# Patient Record
Sex: Female | Born: 1985 | Race: Black or African American | Hispanic: No | Marital: Single | Smoking: Never smoker
Health system: Southern US, Community
[De-identification: ages and names within clinical notes are randomized; demographics above are authoritative.]

## PROBLEM LIST (undated history)

## (undated) DIAGNOSIS — O139 Gestational [pregnancy-induced] hypertension without significant proteinuria, unspecified trimester: Secondary | ICD-10-CM

## (undated) DIAGNOSIS — D563 Thalassemia minor: Secondary | ICD-10-CM

## (undated) DIAGNOSIS — Z973 Presence of spectacles and contact lenses: Secondary | ICD-10-CM

## (undated) DIAGNOSIS — Z789 Other specified health status: Secondary | ICD-10-CM

## (undated) HISTORY — DX: Other specified health status: Z78.9

## (undated) HISTORY — DX: Gestational (pregnancy-induced) hypertension without significant proteinuria, unspecified trimester: O13.9

---
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
## 2009-11-23 ENCOUNTER — Emergency Department (HOSPITAL_COMMUNITY): Admission: EM | Admit: 2009-11-23 | Discharge: 2009-11-23 | Payer: Self-pay | Admitting: Emergency Medicine

## 2010-11-03 ENCOUNTER — Inpatient Hospital Stay (HOSPITAL_COMMUNITY)
Admission: AD | Admit: 2010-11-03 | Discharge: 2010-11-03 | Payer: Self-pay | Source: Home / Self Care | Admitting: Obstetrics & Gynecology

## 2010-11-06 LAB — URINALYSIS, ROUTINE W REFLEX MICROSCOPIC
Hgb urine dipstick: NEGATIVE
Specific Gravity, Urine: 1.025 (ref 1.005–1.030)
Urine Glucose, Fasting: NEGATIVE mg/dL

## 2010-11-06 LAB — WET PREP, GENITAL
Trich, Wet Prep: NONE SEEN
Yeast Wet Prep HPF POC: NONE SEEN

## 2010-11-06 LAB — URINE MICROSCOPIC-ADD ON

## 2010-12-08 LAB — RPR: RPR: NONREACTIVE

## 2010-12-08 LAB — RUBELLA ANTIBODY, IGM: Rubella: IMMUNE

## 2010-12-08 LAB — ABO/RH

## 2010-12-08 LAB — HEPATITIS B SURFACE ANTIGEN: Hepatitis B Surface Ag: NEGATIVE

## 2011-01-04 LAB — URINALYSIS, ROUTINE W REFLEX MICROSCOPIC
Bilirubin Urine: NEGATIVE
Hgb urine dipstick: NEGATIVE
Ketones, ur: NEGATIVE mg/dL
Nitrite: POSITIVE — AB
Protein, ur: 30 mg/dL — AB
Urobilinogen, UA: 1 mg/dL (ref 0.0–1.0)

## 2011-01-04 LAB — URINE MICROSCOPIC-ADD ON

## 2011-04-16 LAB — STREP B DNA PROBE: GBS: NEGATIVE

## 2011-04-30 ENCOUNTER — Encounter (HOSPITAL_COMMUNITY): Payer: Self-pay | Admitting: *Deleted

## 2011-04-30 ENCOUNTER — Inpatient Hospital Stay (HOSPITAL_COMMUNITY)
Admission: AD | Admit: 2011-04-30 | Discharge: 2011-05-02 | DRG: 775 | Disposition: A | Discharge status: Home / Self Care | Payer: Medicaid Other | Source: Ambulatory Visit | Attending: Obstetrics and Gynecology | Admitting: Obstetrics and Gynecology

## 2011-04-30 ENCOUNTER — Encounter (HOSPITAL_COMMUNITY): Payer: Self-pay | Admitting: Anesthesiology

## 2011-04-30 ENCOUNTER — Inpatient Hospital Stay (HOSPITAL_COMMUNITY): Payer: Medicaid Other | Admitting: Anesthesiology

## 2011-04-30 DIAGNOSIS — IMO0002 Reserved for concepts with insufficient information to code with codable children: Secondary | ICD-10-CM | POA: Diagnosis present

## 2011-04-30 DIAGNOSIS — O4290 Premature rupture of membranes, unspecified as to length of time between rupture and onset of labor, unspecified weeks of gestation: Principal | ICD-10-CM | POA: Diagnosis present

## 2011-04-30 DIAGNOSIS — Z331 Pregnant state, incidental: Secondary | ICD-10-CM

## 2011-04-30 DIAGNOSIS — E669 Obesity, unspecified: Secondary | ICD-10-CM

## 2011-04-30 LAB — CBC
MCH: 24 pg — ABNORMAL LOW (ref 26.0–34.0)
Platelets: 260 10*3/uL (ref 150–400)
RBC: 4.62 MIL/uL (ref 3.87–5.11)
WBC: 7.7 10*3/uL (ref 4.0–10.5)

## 2011-04-30 MED ORDER — TERBUTALINE SULFATE 1 MG/ML IJ SOLN: 0.2500 mg | Freq: Once | INTRAMUSCULAR | Status: AC | PRN

## 2011-04-30 MED ORDER — EPHEDRINE 5 MG/ML INJ
10.0000 mg | INTRAVENOUS | Status: DC | PRN
  Filled 2011-04-30 (×2): qty 4

## 2011-04-30 MED ORDER — LACTATED RINGERS IV SOLN
500.0000 mL | Freq: Once | INTRAVENOUS | Status: AC
  Administered 2011-04-30: 500 mL via INTRAVENOUS

## 2011-04-30 MED ORDER — CITRIC ACID-SODIUM CITRATE 334-500 MG/5ML PO SOLN: 30.0000 mL | ORAL | Status: DC | PRN

## 2011-04-30 MED ORDER — ONDANSETRON HCL 4 MG/2ML IJ SOLN: 4.0000 mg | Freq: Four times a day (QID) | INTRAMUSCULAR | Status: DC | PRN

## 2011-04-30 MED ORDER — IBUPROFEN 600 MG PO TABS
600.0000 mg | ORAL_TABLET | Freq: Four times a day (QID) | ORAL | Status: DC | PRN
  Administered 2011-04-30: 600 mg via ORAL
  Filled 2011-04-30: qty 1

## 2011-04-30 MED ORDER — PHENYLEPHRINE 40 MCG/ML (10ML) SYRINGE FOR IV PUSH (FOR BLOOD PRESSURE SUPPORT)
80.0000 ug | PREFILLED_SYRINGE | INTRAVENOUS | Status: DC | PRN
  Filled 2011-04-30: qty 5

## 2011-04-30 MED ORDER — PHENYLEPHRINE 40 MCG/ML (10ML) SYRINGE FOR IV PUSH (FOR BLOOD PRESSURE SUPPORT)
80.0000 ug | PREFILLED_SYRINGE | INTRAVENOUS | Status: DC | PRN
  Filled 2011-04-30 (×2): qty 5

## 2011-04-30 MED ORDER — LACTATED RINGERS IV SOLN
INTRAVENOUS | Status: DC
  Administered 2011-04-30: 125 mL/h via INTRAVENOUS

## 2011-04-30 MED ORDER — NALBUPHINE SYRINGE 5 MG/0.5 ML
5.0000 mg | INJECTION | INTRAMUSCULAR | Status: DC | PRN
  Filled 2011-04-30 (×2): qty 0.5

## 2011-04-30 MED ORDER — NALBUPHINE HCL 10 MG/ML IJ SOLN
10.0000 mg | Freq: Four times a day (QID) | INTRAMUSCULAR | Status: DC | PRN
  Filled 2011-04-30: qty 1

## 2011-04-30 MED ORDER — LIDOCAINE HCL (PF) 2 % IJ SOLN
INTRAMUSCULAR | Status: DC | PRN
  Administered 2011-04-30 (×3): 40 mg

## 2011-04-30 MED ORDER — DIPHENHYDRAMINE HCL 50 MG/ML IJ SOLN: 12.5000 mg | INTRAMUSCULAR | Status: DC | PRN

## 2011-04-30 MED ORDER — ACETAMINOPHEN 325 MG PO TABS: 650.0000 mg | ORAL_TABLET | ORAL | Status: DC | PRN

## 2011-04-30 MED ORDER — FENTANYL 2.5 MCG/ML BUPIVACAINE 1/10 % EPIDURAL INFUSION (WH - ANES)
14.0000 mL/h | INTRAMUSCULAR | Status: DC
  Administered 2011-04-30: 12 mL/h via EPIDURAL
  Administered 2011-04-30: 14 mL/h via EPIDURAL
  Filled 2011-04-30 (×2): qty 60

## 2011-04-30 MED ORDER — OXYTOCIN 20 UNITS IN LACTATED RINGERS INFUSION - SIMPLE
1.0000 m[IU]/min | INTRAVENOUS | Status: DC
  Administered 2011-04-30: 1 m[IU]/min via INTRAVENOUS
  Filled 2011-04-30: qty 1000

## 2011-04-30 MED ORDER — EPHEDRINE 5 MG/ML INJ
10.0000 mg | INTRAVENOUS | Status: DC | PRN
  Filled 2011-04-30: qty 4

## 2011-04-30 MED ORDER — OXYTOCIN 20 UNITS IN LACTATED RINGERS INFUSION - SIMPLE: 125.0000 mL/h | Freq: Once | INTRAVENOUS | Status: DC

## 2011-04-30 MED ORDER — OXYCODONE-ACETAMINOPHEN 5-325 MG PO TABS: 2.0000 | ORAL_TABLET | ORAL | Status: DC | PRN

## 2011-04-30 MED ORDER — LACTATED RINGERS IV SOLN: 500.0000 mL | INTRAVENOUS | Status: DC | PRN

## 2011-04-30 MED ORDER — LIDOCAINE HCL (PF) 1 % IJ SOLN
30.0000 mL | INTRAMUSCULAR | Status: DC | PRN
  Filled 2011-04-30 (×2): qty 30

## 2011-04-30 MED ORDER — FLEET ENEMA 7-19 GM/118ML RE ENEM: 1.0000 | ENEMA | RECTAL | Status: DC | PRN

## 2011-04-30 NOTE — Anesthesia Procedure Notes (Addendum)
Epidural Patient location during procedure: OB Start time: 04/30/2011 4:30 PM  Staffing Anesthesiologist: Jiles Garter  Preanesthetic Checklist Completed: patient identified, site marked, surgical consent, pre-op evaluation, timeout performed, IV checked, risks and benefits discussed and monitors and equipment checked  Epidural Patient position: sitting Prep: DuraPrep Patient monitoring: continuous pulse ox and blood pressure Approach: midline Injection technique: LOR air  Needle Needle type: Tuohy  Needle gauge: 17 G Needle length: 9 cm Catheter type: closed end flexible Catheter size: 19 Gauge Test dose: negative  Assessment Events: blood not aspirated, injection not painful, no injection resistance, negative IV test and no paresthesia  Additional Notes Patient is more comfortable after epidural dosed. Please see RN's note for documentation of vital signs,and FHR which are stable.

## 2011-04-30 NOTE — Progress Notes (Signed)
Jenny Carr is a 25 y.o. G1P0 at [redacted]w[redacted]d by ultrasound admitted for PROM, now being Augmented with Pitocin  Subjective: Had one dose of Nubain, but c/o increased rectal pressure and desires Epidural Anesthesia   Objective: BP 130/60  Pulse 82  Temp(Src) 98.9 F (37.2 C) (Oral)  Resp 22  Ht 5\' 1"  (1.549 m)  Wt 97.523 kg (215 lb)  BMI 40.62 kg/m2      FHT:  FHR: 130 bpm, variability: minimal ,  accelerations:  Abscent,  decelerations:  Present earlys UC:   regular, every 2-3 minutes SVE:   6/100/-1 to 0 st Noted large amt of stool in rectum Labs: Lab Results  Component Value Date   WBC 7.7 04/30/2011   HGB 11.1* 04/30/2011   HCT 34.1* 04/30/2011   MCV 73.8* 04/30/2011   PLT 260 04/30/2011    Assessment / Plan: Augmentation of labor, progressing well  Labor: Progressing normally Preeclampsia:  no signs or symptoms of toxicity Fetal Wellbeing:  Category II Pain Control:  Pending Epidural Anticipated MOD:  NSVD  Anabel Halon 04/30/2011, 3:39 PM

## 2011-04-30 NOTE — Progress Notes (Signed)
Thinks water broke @ 0429 this morning.  No pain at present, but does feel pressure.   39wks, G1

## 2011-04-30 NOTE — H&P (Signed)
Jenny Carr is a 25 y.o. female presenting for SROM @ 0400, clear. Maternal Medical History:  Reason for admission: Reason for admission: rupture of membranes.  Contractions: Onset was 1-2 hours ago.   Frequency: irregular.   Duration is approximately 3040 seconds.   Perceived severity is mild.    Fetal activity: Perceived fetal activity is normal.   Last perceived fetal movement was within the past hour.    Prenatal complications: no prenatal complications   OB History    Grav Para Term Preterm Abortions TAB SAB Ect Mult Living   1              No past medical history on file. History reviewed. No pertinent past surgical history. Family History: family history is not on file. Social History:  reports that she has never smoked. She does not have any smokeless tobacco history on file. She reports that she does not drink alcohol or use illicit drugs.  Review of Systems  Constitutional: Negative.   Eyes: Negative.   Respiratory: Negative.   Cardiovascular: Negative.   Gastrointestinal: Negative.   Genitourinary: Negative.   Skin: Negative.   Neurological: Positive for headaches (states had HA yesterday, none since).  Endo/Heme/Allergies: Negative.   Psychiatric/Behavioral: Negative.   All other systems reviewed and are negative.    Dilation: 1.5 Effacement (%): 80 Station: -1 Exam by:: D. Ladona Ridgel CNM Blood pressure 135/82, pulse 77, temperature 98.5 F (36.9 C), temperature source Oral, resp. rate 20, height 5' 1.5" (1.562 m), weight 97.614 kg (215 lb 3.2 oz). Maternal Exam:  Uterine Assessment: Contraction strength is mild.  Contraction duration is 30 seconds. Contraction frequency is irregular.   Abdomen: Patient reports no abdominal tenderness. Fundal height is AGA.   Fetal presentation: vertex  Introitus: Normal vulva. Vagina is positive for vaginal discharge (thin, watery, no odor).  Ferning test: not done.  Nitrazine test: not done. Amniotic fluid  character: clear.  Pelvis: adequate for delivery.   Cervix: Cervix evaluated by sterile speculum exam and digital exam.     Fetal Exam Fetal Monitor Review: Mode: ultrasound.   Baseline rate: 145.  Variability: moderate (6-25 bpm).   Pattern: accelerations present and no decelerations.    Fetal State Assessment: Category I - tracings are normal.     Physical Exam  Constitutional: She is oriented to person, place, and time. She appears well-developed and well-nourished. No distress.  HENT:  Head: Normocephalic.  Cardiovascular: Normal rate and regular rhythm.  Exam reveals no gallop and no friction rub.   No murmur heard. Respiratory: Effort normal and breath sounds normal. No respiratory distress.  GI: Soft.  Genitourinary: Uterus normal. Vaginal discharge (thin, watery, no odor) found.  Musculoskeletal: Normal range of motion. She exhibits no edema.  Neurological: She is alert and oriented to person, place, and time.  Skin: Skin is warm and dry.  Psychiatric: She has a normal mood and affect. Her behavior is normal. Judgment and thought content normal.   SVE 1-2/80/-1 vtx Prenatal labs: ABO, Rh:  Apos Antibody: Negative (02/24 0000) Rubella:  immune RPR: Nonreactive (02/24 0000)  HBsAg: Negative (02/24 0000)  HIV: Non-reactive (02/24 0000)  GBS: Negative (07/02 0000)   Assessment/Plan: 1. 25 y/o G1P0 w/SROM @ 39.4 wks 2. Cat 1 FHR Tracing 3. Occ, Mild UC  Plan: 1. Admit to [facility] CNM service 2. Routine CNM Intrapartum orders 3. Expectant Management 4. Augment prn 5. MD  notified of pt arrival, assessment and POC  Anabel Halon 04/30/2011,  7:44 AM

## 2011-04-30 NOTE — Progress Notes (Signed)
Jenny Carr is a 25 y.o. G1P0 at [redacted]w[redacted]d by LMP admitted for rupture of membranes, Now on Pitocin Augmentation  Subjective: C/O increased pressure w/UC's and "feels like she has to have BM"   Objective: BP 126/80  Pulse 70  Temp(Src) 98.2 F (36.8 C) (Oral)  Resp 18  Ht 5\' 1"  (1.549 m)  Wt 97.523 kg (215 lb)  BMI 40.62 kg/m2  SpO2 100%      FHT:  FHR: 130 bpm, variability: moderate,  accelerations:  Present,  decelerations:  Absent UC:   regular, every 2-3 minutes SVE:   Dilation: Lip/rim Effacement (%): 100 Station: 0 Exam by:: Glenn Gullickson, cnm  Labs: Lab Results  Component Value Date   WBC 7.7 04/30/2011   HGB 11.1* 04/30/2011   HCT 34.1* 04/30/2011   MCV 73.8* 04/30/2011   PLT 260 04/30/2011    Assessment / Plan: Augmentation of labor, progressing well  Labor: Progressing normally Preeclampsia:  no signs or symptoms of toxicity Fetal Wellbeing:  Category I Pain Control:  Epidural I/D:  n/a Anticipated MOD:  NSVD   Anabel Halon 04/30/2011, 6:06 PM

## 2011-04-30 NOTE — Anesthesia Preprocedure Evaluation (Signed)
Anesthesia Evaluation  Name, MR# and DOB Patient awake  General Assessment Comment  Reviewed: Allergy & Precautions, H&P  and Patient's Chart, lab work & pertinent test results  Airway Mallampati: IV TM Distance: >3 FB Neck ROM: full    Dental  (+) Teeth Intact   Pulmonary  clear to auscultation    Cardiovascular regular Normal   Neuro/Psych  GI/Hepatic/Renal   Endo/Other   (+)  Morbid obesity Abdominal   Musculoskeletal  Hematology   Peds  Reproductive/Obstetrics (+) Pregnancy   Anesthesia Other Findings 11/34   260 Plts            Anesthesia Physical Anesthesia Plan  ASA: III  Anesthesia Plan: Epidural   Post-op Pain Management:    Induction:   Airway Management Planned:   Additional Equipment:   Intra-op Plan:   Post-operative Plan:   Informed Consent: I have reviewed the patients History and Physical, chart, labs and discussed the procedure including the risks, benefits and alternatives for the proposed anesthesia with the patient or authorized representative who has indicated his/her understanding and acceptance.   Dental Advisory Given  Plan Discussed with: CRNA and Surgeon  Anesthesia Plan Comments: (Labs checked- platelets confirmed with RN in room. Fetal heart tracing, per RN, reportedly stable enough for sitting procedure. Discussed epidural, and patient consents to the procedure:  included risk of possible headache,backache, failed block, allergic reaction, and nerve injury. This patient was asked if she had any questions or concerns before the procedure started. )        Anesthesia Quick Evaluation

## 2011-04-30 NOTE — ED Notes (Signed)
Pt to room 165 via wheelchair.

## 2011-04-30 NOTE — Progress Notes (Signed)
Jenny Carr is a 25 y.o. G1P0 at [redacted]w[redacted]d by LMP admitted for PROM  Subjective: Resting comfortably, family @ Bedside Reviewed Plan of care w/pt. Recommend starting Pitocin Augmentation since UC's now infrequent   Objective: BP 131/88  Pulse 72  Temp(Src) 98.4 F (36.9 C) (Oral)  Resp 18  Ht 5\' 1"  (1.549 m)  Wt 97.523 kg (215 lb)  BMI 40.62 kg/m2      FHT:  FHR: 135 bpm, variability: moderate,  accelerations:  Present,  decelerations:  Present occ early UC:   irregular, every 2-10 minutes SVE:   Deferred @ this time Labs: Lab Results  Component Value Date   WBC 7.7 04/30/2011   HGB 11.1* 04/30/2011   HCT 34.1* 04/30/2011   MCV 73.8* 04/30/2011   PLT 260 04/30/2011    Assessment / Plan: SROM no labor  Labor: Needs Augmentation Preeclampsia:  no signs or symptoms of toxicity Fetal Wellbeing:  Category I Pain Control:  Labor support without medications Plan: Start Pitocin Augmentation per low dose protocol Pt. And Family voice understanding and agree with plan of care Epidural or IV Nubain prn  Anabel Halon 04/30/2011, 11:36 AM

## 2011-04-30 NOTE — Progress Notes (Signed)
Jenny Carr is a 25 y.o. G1P0 at [redacted]w[redacted]d by LMP admitted for rupture of membranes, active augmentation with pitocin   Subjective:  S/p epidural now comfortable Pitocin infusion @ 4 mu/min States just feels pressure with UC's no urge to push    Objective: BP 125/69  Pulse 68  Temp(Src) 98.9 F (37.2 C) (Oral)  Resp 22  Ht 5\' 1"  (1.549 m)  Wt 97.523 kg (215 lb)  BMI 40.62 kg/m2  SpO2 98%      FHT:  FHR: 140 bpm, variability: moderate,  accelerations:  Present,  decelerations:  Present early UC:   regular, every 2-3 minutes SVE:   8/C/0 Foley cath placed without diff w/cl yellow urine Labs: Lab Results  Component Value Date   WBC 7.7 04/30/2011   HGB 11.1* 04/30/2011   HCT 34.1* 04/30/2011   MCV 73.8* 04/30/2011   PLT 260 04/30/2011    Assessment / Plan: Augmentation of labor, progressing well  Labor: Progressing normally Preeclampsia:  no signs or symptoms of toxicity Fetal Wellbeing:  Category I Pain Control:  Epidural I/D:  n/a Anticipated MOD:  NSVD  Anabel Halon 04/30/2011, 5:05 PM

## 2011-04-30 NOTE — Progress Notes (Signed)
Jenny Carr is a 25 y.o. G1P0 at [redacted]w[redacted]d by LMP admitted for PROM  Subjective:   Objective: BP 132/77  Pulse 72  Temp(Src) 98.4 F (36.9 C) (Oral)  Resp 20  Ht 5\' 1"  (1.549 m)  Wt 97.523 kg (215 lb)  BMI 40.62 kg/m2      FHT:  FHR: 150 bpm, variability: moderate,  accelerations:  Present,  decelerations:  Absent UC:   irregular, every 2-5 minutes SVE:   Dilation: 1.5 Effacement (%): 80 Station: -1 Exam by:: Irish Lack CNM  Labs: Lab Results  Component Value Date   WBC 7.7 04/30/2011   HGB 11.1* 04/30/2011   HCT 34.1* 04/30/2011   MCV 73.8* 04/30/2011   PLT 260 04/30/2011    Assessment / Plan: SROM @ 0430   Labor: Latent Preeclampsia:  no signs or symptoms of toxicity Fetal Wellbeing:  Category I Pain Control:  Labor support without medications P: Augment with Pitocin if no cervical change in 2 hours Anticipated MOD:  NSVD Dr. [name] aware  Anabel Halon 04/30/2011, 9:42 AM

## 2011-04-30 NOTE — Progress Notes (Signed)
Delivery Note At 7:58 PM a viable and healthy female was delivered via Vaginal, Spontaneous Delivery (Presentation: Right Occiput Anterior).  APGAR: 8, 9; weight 6 lb 4 oz (2835 g).   Placenta status: Intact, Spontaneous.  Cord: 3 vessels with the following complications: None.    Anesthesia: Epidural  Episiotomy: None Lacerations: 2nd degree Suture Repair: 3.0 vicryl Est. Blood Loss (mL): 200  Mom to postpartum.  Baby to nursery-stable.  Baby name "Jenny Carr" mom desires OP Circ Dr [name] notified  [name] 04/30/2011, 10:16 PM

## 2011-05-01 LAB — CBC
HCT: 26.1 % — ABNORMAL LOW (ref 36.0–46.0)
Hemoglobin: 8.6 g/dL — ABNORMAL LOW (ref 12.0–15.0)
MCH: 24.4 pg — ABNORMAL LOW (ref 26.0–34.0)
MCHC: 33 g/dL (ref 30.0–36.0)
MCV: 73.9 fL — ABNORMAL LOW (ref 78.0–100.0)
Platelets: 233 10*3/uL (ref 150–400)
RBC: 3.53 MIL/uL — ABNORMAL LOW (ref 3.87–5.11)
RDW: 15.4 % (ref 11.5–15.5)
WBC: 11.5 10*3/uL — ABNORMAL HIGH (ref 4.0–10.5)

## 2011-05-01 LAB — RPR: RPR Ser Ql: NONREACTIVE

## 2011-05-01 MED ORDER — BENZOCAINE-MENTHOL 20-0.5 % EX AERO
INHALATION_SPRAY | CUTANEOUS | Status: AC
  Filled 2011-05-01: qty 56

## 2011-05-01 MED ORDER — SIMETHICONE 80 MG PO CHEW: 80.0000 mg | CHEWABLE_TABLET | ORAL | Status: DC | PRN

## 2011-05-01 MED ORDER — SENNOSIDES-DOCUSATE SODIUM 8.6-50 MG PO TABS
1.0000 | ORAL_TABLET | Freq: Every day | ORAL | Status: DC
  Administered 2011-05-01: 1 via ORAL

## 2011-05-01 MED ORDER — PRENATAL PLUS 27-1 MG PO TABS
1.0000 | ORAL_TABLET | Freq: Every day | ORAL | Status: DC
  Administered 2011-05-01 – 2011-05-02 (×2): 1 via ORAL
  Filled 2011-05-01 (×2): qty 1

## 2011-05-01 MED ORDER — IBUPROFEN 600 MG PO TABS
600.0000 mg | ORAL_TABLET | Freq: Four times a day (QID) | ORAL | Status: DC
  Administered 2011-05-01 – 2011-05-02 (×6): 600 mg via ORAL
  Filled 2011-05-01 (×6): qty 1

## 2011-05-01 MED ORDER — DIPHENHYDRAMINE HCL 25 MG PO CAPS: 25.0000 mg | ORAL_CAPSULE | Freq: Four times a day (QID) | ORAL | Status: DC | PRN

## 2011-05-01 MED ORDER — ZOLPIDEM TARTRATE 5 MG PO TABS: 5.0000 mg | ORAL_TABLET | Freq: Every evening | ORAL | Status: DC | PRN

## 2011-05-01 MED ORDER — MEASLES, MUMPS & RUBELLA VAC ~~LOC~~ INJ
0.5000 mL | INJECTION | Freq: Once | SUBCUTANEOUS | Status: DC
  Filled 2011-05-01: qty 0.5

## 2011-05-01 MED ORDER — TETANUS-DIPHTH-ACELL PERTUSSIS 5-2.5-18.5 LF-MCG/0.5 IM SUSP
0.5000 mL | Freq: Once | INTRAMUSCULAR | Status: AC
  Administered 2011-05-01: 0.5 mL via INTRAMUSCULAR
  Filled 2011-05-01: qty 0.5

## 2011-05-01 MED ORDER — WITCH HAZEL-GLYCERIN EX PADS: MEDICATED_PAD | CUTANEOUS | Status: DC | PRN

## 2011-05-01 MED ORDER — DIBUCAINE 1 % RE OINT
TOPICAL_OINTMENT | RECTAL | Status: DC | PRN
  Filled 2011-05-01: qty 28

## 2011-05-01 MED ORDER — ONDANSETRON HCL 4 MG PO TABS: 4.0000 mg | ORAL_TABLET | ORAL | Status: DC | PRN

## 2011-05-01 MED ORDER — LANOLIN HYDROUS EX OINT: TOPICAL_OINTMENT | CUTANEOUS | Status: DC | PRN

## 2011-05-01 MED ORDER — BENZOCAINE-MENTHOL 20-0.5 % EX AERO: 1.0000 "application " | INHALATION_SPRAY | CUTANEOUS | Status: DC | PRN

## 2011-05-01 MED ORDER — FERROUS SULFATE 325 (65 FE) MG PO TABS
325.0000 mg | ORAL_TABLET | Freq: Three times a day (TID) | ORAL | Status: DC
  Administered 2011-05-01 – 2011-05-02 (×2): 325 mg via ORAL
  Filled 2011-05-01 (×2): qty 1

## 2011-05-01 MED ORDER — ONDANSETRON HCL 4 MG/2ML IJ SOLN: 4.0000 mg | INTRAMUSCULAR | Status: DC | PRN

## 2011-05-01 MED ORDER — OXYCODONE-ACETAMINOPHEN 5-325 MG PO TABS
1.0000 | ORAL_TABLET | ORAL | Status: DC | PRN
  Administered 2011-05-02 (×2): 2 via ORAL
  Filled 2011-05-01 (×2): qty 2

## 2011-05-01 NOTE — Progress Notes (Signed)
UR Chart review completed.  

## 2011-05-01 NOTE — Progress Notes (Signed)
PATIENT IS BOTTLEFEEDING BUT SHE STATES SHE WOULD LIKE TO KNOW TECHNIQUES FOR POSITIONING IN THE EVENT SHE CHANGES HER MIND WHEN SHE GOES HOME.   BABY HAS RECENTLY HAD FORMULA AND NO FEEDING CUES AT PRESENT.  INSTRUCTED PATIENT TO CALL LC FOR ASSIST WHEN BABY READY TO FEED.

## 2011-05-01 NOTE — Progress Notes (Signed)
Post Partum Day 1 Subjective: no complaints  Objective: Blood pressure 144/75, pulse 66, temperature 97.4 F (36.3 C), temperature source Oral, resp. rate 18, height 5\' 1"  (1.549 m), weight 97.523 kg (215 lb), SpO2 100.00%, unknown if currently breastfeeding.  Physical Exam:  General: alert Lochia: appropriate Uterine Fundus: firm Incision: healing well DVT Evaluation: No evidence of DVT seen on physical exam.   Basename 05/01/11 0507 04/30/11 0825  HGB 8.6* 11.1*  HCT 26.1* 34.1*    Assessment/Plan: Plan for discharge tomorrow Orthostatic BP and pulses Start FE Plans outpatient circumcision Undecided re: contraception Declines transfusion   LOS: 1 day   Katherin Ramey L 05/01/2011, 8:45 AM

## 2011-05-02 LAB — CBC
HCT: 26.1 % — ABNORMAL LOW (ref 36.0–46.0)
Hemoglobin: 8.5 g/dL — ABNORMAL LOW (ref 12.0–15.0)
MCH: 24.3 pg — ABNORMAL LOW (ref 26.0–34.0)
MCHC: 32.6 g/dL (ref 30.0–36.0)
RDW: 15.7 % — ABNORMAL HIGH (ref 11.5–15.5)

## 2011-05-02 NOTE — Discharge Summary (Signed)
Obstetric Discharge Summary Reason for Admission: onset of labor and rupture of membranes Prenatal Procedures: ultrasound Intrapartum Procedures: spontaneous vaginal delivery and 2nd degree perineal lac with repair Per Gevena Barre CNM Postpartum Procedures: none Complications-Operative and Postpartum: 2nd degree perineal laceration  Hemoglobin  Date Value Range Status  05/02/2011 8.5* 12.0-15.0 (g/dL) Final     HCT  Date Value Range Status  05/02/2011 26.1* 36.0-46.0 (%) Final    Discharge Diagnoses: Term Pregnancy-delivered  Discharge Information: Date: 05/02/2011 Activity: pelvic rest Diet: routine Medications: Ibuprophen and Iron Condition: stable Instructions: refer to practice specific booklet Discharge to: home Follow-up Information    Follow up with [facility]. Make an appointment in 6 weeks. (OR As needed if symptoms worsen)          Newborn Data: Live born  Information for the patient's newborn:  Brenisha, Tsui [[id number]]  female ; APGAR 8/9, ; weight 6lbs 4oz  ;  Home with mother.  Anabel Halon 05/02/2011, 8:05 AM

## 2011-05-02 NOTE — Progress Notes (Signed)
Post Partum Day 2 Subjective: no complaints, up ad lib, voiding, tolerating PO and Desires Discharge home  Objective: Blood pressure 108/67, pulse 67, temperature 98.1 F (36.7 C), temperature source Oral, resp. rate 20, height 5\' 1"  (1.549 m), weight 97.523 kg (215 lb), SpO2 100.00%, unknown if currently breastfeeding.  Physical Exam:  General: alert, cooperative and no distress Lochia: Small Rubra no clots Uterine Fundus: Firm @ U-1 Midline Incision: healing well, no significant drainage DVT Evaluation: No evidence of DVT seen on physical exam. Negative Homan's sign. No cords or calf tenderness.   Basename 05/02/11 0521 05/01/11 0507  HGB 8.5* 8.6*  HCT 26.1* 26.1*    Assessment/Plan: Discharge home, Breastfeeding and Contraception Pt has appt for Nextplanon insertion in office Appt for Outpt Circ early next week Continue PNV and FE Motrin 600 mg po prn pain   LOS: 2 days   Anabel Halon 05/02/2011, 8:02 AM

## 2011-05-07 NOTE — Anesthesia Postprocedure Evaluation (Signed)
  Anesthesia Post-op Note  Patient: Jenny Carr   Patient is awake, responsive, moving her legs, and has signs of resolution of her numbness. Pain and nausea are reasonably well controlled. Vital signs are stable and clinically acceptable. Oxygen saturation is clinically acceptable. There are no apparent anesthetic complications at this time. Patient is ready for discharge.

## 2014-08-16 ENCOUNTER — Encounter (HOSPITAL_COMMUNITY): Payer: Self-pay | Admitting: *Deleted

## 2014-10-15 NOTE — L&D Delivery Note (Cosign Needed)
Operative Delivery Note At 12:14 PM a viable female was delivered via Vaginal, Spontaneous Delivery.  Presentation: vertex; Position: Left,, Occiput,, Anterior; Station: +3.  Verbal consent: obtained from patient.  Risks and benefits discussed in detail.  Risks include, but are not limited to the risks of anesthesia, bleeding, infection, damage to maternal tissues, fetal cephalhematoma.  There is also the risk of inability to effect vaginal delivery of the head, or shoulder dystocia that cannot be resolved by established maneuvers, leading to the need for emergency cesarean section.  APGAR: 8, 9; weight  .   Placenta status: Intact, Spontaneous.   Cord: 3 vessels with the following complications: Short.  Cord pH: n/a  Anesthesia: Epidural  Instruments: kiwi Episiotomy: None Lacerations: 1st degree Suture Repair: n/a Est. Blood Loss200 (mL):    Mom to postpartum.  Baby to Couplet care / Skin to Skin.  Wyvonnia DuskyLAWSON, Jenny Carr 09/18/2015, 12:45 PM

## 2015-01-14 ENCOUNTER — Emergency Department (HOSPITAL_COMMUNITY)
Admission: EM | Admit: 2015-01-14 | Discharge: 2015-01-14 | Disposition: A | Discharge status: Home / Self Care | Payer: Medicaid Other | Attending: Emergency Medicine | Admitting: Emergency Medicine

## 2015-01-14 ENCOUNTER — Emergency Department (HOSPITAL_COMMUNITY): Payer: Medicaid Other

## 2015-01-14 ENCOUNTER — Encounter (HOSPITAL_COMMUNITY): Payer: Self-pay | Admitting: Emergency Medicine

## 2015-01-14 DIAGNOSIS — Z3A01 Less than 8 weeks gestation of pregnancy: Secondary | ICD-10-CM | POA: Insufficient documentation

## 2015-01-14 DIAGNOSIS — O2341 Unspecified infection of urinary tract in pregnancy, first trimester: Secondary | ICD-10-CM | POA: Insufficient documentation

## 2015-01-14 DIAGNOSIS — N939 Abnormal uterine and vaginal bleeding, unspecified: Secondary | ICD-10-CM

## 2015-01-14 DIAGNOSIS — Z3490 Encounter for supervision of normal pregnancy, unspecified, unspecified trimester: Secondary | ICD-10-CM

## 2015-01-14 DIAGNOSIS — O209 Hemorrhage in early pregnancy, unspecified: Secondary | ICD-10-CM | POA: Diagnosis present

## 2015-01-14 DIAGNOSIS — O200 Threatened abortion: Secondary | ICD-10-CM | POA: Diagnosis not present

## 2015-01-14 LAB — URINE MICROSCOPIC-ADD ON

## 2015-01-14 LAB — CBC WITH DIFFERENTIAL/PLATELET
BASOS ABS: 0 10*3/uL (ref 0.0–0.1)
Basophils Relative: 0 % (ref 0–1)
EOS ABS: 0.2 10*3/uL (ref 0.0–0.7)
Eosinophils Relative: 5 % (ref 0–5)
HCT: 39 % (ref 36.0–46.0)
Hemoglobin: 12.3 g/dL (ref 12.0–15.0)
LYMPHS PCT: 38 % (ref 12–46)
Lymphs Abs: 1.8 10*3/uL (ref 0.7–4.0)
MCH: 23.1 pg — AB (ref 26.0–34.0)
MCHC: 31.5 g/dL (ref 30.0–36.0)
MCV: 73.3 fL — ABNORMAL LOW (ref 78.0–100.0)
MONO ABS: 0.3 10*3/uL (ref 0.1–1.0)
Monocytes Relative: 7 % (ref 3–12)
NEUTROS PCT: 50 % (ref 43–77)
Neutro Abs: 2.5 10*3/uL (ref 1.7–7.7)
PLATELETS: 305 10*3/uL (ref 150–400)
RBC: 5.32 MIL/uL — ABNORMAL HIGH (ref 3.87–5.11)
RDW: 14.6 % (ref 11.5–15.5)
WBC: 4.8 10*3/uL (ref 4.0–10.5)

## 2015-01-14 LAB — URINALYSIS, ROUTINE W REFLEX MICROSCOPIC
BILIRUBIN URINE: NEGATIVE
Glucose, UA: NEGATIVE mg/dL
KETONES UR: NEGATIVE mg/dL
NITRITE: NEGATIVE
PH: 5 (ref 5.0–8.0)
PROTEIN: NEGATIVE mg/dL
SPECIFIC GRAVITY, URINE: 1.027 (ref 1.005–1.030)
UROBILINOGEN UA: 0.2 mg/dL (ref 0.0–1.0)

## 2015-01-14 LAB — POC URINE PREG, ED: PREG TEST UR: POSITIVE — AB

## 2015-01-14 LAB — I-STAT CHEM 8, ED
BUN: 8 mg/dL (ref 6–23)
CALCIUM ION: 1.23 mmol/L (ref 1.12–1.23)
Chloride: 102 mmol/L (ref 96–112)
Creatinine, Ser: 0.6 mg/dL (ref 0.50–1.10)
Glucose, Bld: 95 mg/dL (ref 70–99)
HCT: 42 % (ref 36.0–46.0)
HEMOGLOBIN: 14.3 g/dL (ref 12.0–15.0)
Potassium: 3.6 mmol/L (ref 3.5–5.1)
SODIUM: 138 mmol/L (ref 135–145)
TCO2: 21 mmol/L (ref 0–100)

## 2015-01-14 LAB — WET PREP, GENITAL
Clue Cells Wet Prep HPF POC: NONE SEEN
Trich, Wet Prep: NONE SEEN
WBC, Wet Prep HPF POC: NONE SEEN
Yeast Wet Prep HPF POC: NONE SEEN

## 2015-01-14 LAB — ABO/RH: ABO/RH(D): A POS

## 2015-01-14 LAB — HCG, QUANTITATIVE, PREGNANCY: HCG, BETA CHAIN, QUANT, S: 1296 m[IU]/mL — AB (ref <5)

## 2015-01-14 MED ORDER — PRENATAL COMPLETE 14-0.4 MG PO TABS: 4.0000 mg | ORAL_TABLET | Freq: Every day | ORAL | Status: DC

## 2015-01-14 MED ORDER — CEPHALEXIN 500 MG PO CAPS: 500.0000 mg | ORAL_CAPSULE | Freq: Four times a day (QID) | ORAL | Status: DC

## 2015-01-14 NOTE — ED Notes (Signed)
Pt. Unable to use the restroom at this time, but is aware that we need a urine specimen.  

## 2015-01-14 NOTE — ED Provider Notes (Signed)
CSN: [id number]     Arrival date & time 01/14/15  1219 History   First MD Initiated Contact with Patient 01/14/15 1235     Chief Complaint  Patient presents with  . Vaginal Bleeding     (Consider location/radiation/quality/duration/timing/severity/associated sxs/prior Treatment) HPI  Jenny Carr is a 29 y.o. female with PMH of no surgeries presenting with positive pregnancy test 2 days ago as well as vaginal bleeding last night that has resolved. Patient reported having diffuse abdominal cramping as well as passing small blood clots that has since resolved. She denies any vaginal discharge. No vaginal pain or abdominal pain at this time. No fevers or chills no nausea or vomiting or changes in stool. She denies any urinary symptoms. She has not received any OB/GYN care.   History reviewed. No pertinent past medical history. History reviewed. No pertinent past surgical history. No family history on file. History  Substance Use Topics  . Smoking status: Never Smoker   . Smokeless tobacco: Not on file  . Alcohol Use: No   OB History    Gravida Para Term Preterm AB TAB SAB Ectopic Multiple Living   Review of Systems 10 Systems reviewed and are negative for acute change except as noted in the HPI.    Allergies  Review of patient's allergies indicates no known allergies.  Home Medications   Prior to Admission medications   Medication Sig Start Date End Date Taking? Authorizing Provider  cephALEXin (KEFLEX) 500 MG capsule Take 1 capsule (500 mg total) by mouth 4 (four) times daily. 01/14/15   Oswaldo Conroy, PA-C  Prenatal Vit-Fe Fumarate-FA (PRENATAL COMPLETE) 14-0.4 MG TABS Take 4 mg by mouth daily. 01/14/15   Oswaldo Conroy, PA-C   BP 133/75 mmHg  Pulse 72  Temp(Src) 97.9 F (36.6 C) (Oral)  Resp 16  SpO2 100%  LMP 12/13/2014 Physical Exam  Constitutional: She appears well-developed and well-nourished. No distress.  HENT:  Head: Normocephalic and  atraumatic.  Mouth/Throat: Oropharynx is clear and moist.  Eyes: Conjunctivae and EOM are normal. Right eye exhibits no discharge. Left eye exhibits no discharge.  Cardiovascular: Normal rate and regular rhythm.   Pulmonary/Chest: Effort normal and breath sounds normal. No respiratory distress. She has no wheezes.  Abdominal: Soft. Bowel sounds are normal. She exhibits no distension. There is no tenderness.  Genitourinary:  External genitalia without erythema, tenderness, lesions. Cervix pink without lesions. Os closed. No CMT. Mild left and right adnexal tenderness. No adnexal masses appreciated. Moderate brown discharge without foul odor. Minimal blood and no parts of conception noted in vaginal vault. Nursing tech in room for exam.  Neurological: She is alert. She exhibits normal muscle tone. Coordination normal.  Skin: Skin is warm and dry. She is not diaphoretic.  Nursing note and vitals reviewed.   ED Course  Procedures (including critical care time) Labs Review Labs Reviewed  URINALYSIS, ROUTINE W REFLEX MICROSCOPIC - Abnormal; Notable for the following:    APPearance CLOUDY (*)    Hgb urine dipstick LARGE (*)    Leukocytes, UA LARGE (*)    All other components within normal limits  CBC WITH DIFFERENTIAL/PLATELET - Abnormal; Notable for the following:    RBC 5.32 (*)    MCV 73.3 (*)    MCH 23.1 (*)    All other components within normal limits  HCG, QUANTITATIVE, PREGNANCY - Abnormal; Notable for the following:    hCG, Beta  Chain, Quant, S 1296 (*)    All other components within normal limits  URINE MICROSCOPIC-ADD ON - Abnormal; Notable for the following:    Squamous Epithelial / LPF FEW (*)    Bacteria, UA MANY (*)    Casts HYALINE CASTS (*)    All other components within normal limits  POC URINE PREG, ED - Abnormal; Notable for the following:    Preg Test, Ur POSITIVE (*)    All other components within normal limits  WET PREP, GENITAL  I-STAT CHEM 8, ED  ABO/RH   GC/CHLAMYDIA PROBE AMP ([place])    Imaging Review US Ob Comp Less 14 Wks  01/14/2015   CLINICAL DATA:  Vaginal bleeding for 1 day with cramping. Five weeks 0 days pregnant by last menstrual  EXAM: OBSTETRIC <14 WK [place] AND TRANSVAGINAL OB US  TECHNIQUE: Both transabdominal and transvaginal ultrasound examinations were performed for complete evaluation of the gestation as well as the maternal uterus, adnexal regions, and pelvic cul-de-sac. Transvaginal technique was performed to assess early pregnancy.  COMPARISON:  None.  FINDINGS: Intrauterine gestational sac: Visualized/normal in shape.  Yolk sac:  Not visualized  Embryo:  Not visualized  Cardiac Activity: Not visualized  MSD: 5  mm   5 w   2  d  Maternal uterus/adnexae: No subchorionic hemorrhage. Normal right ovary. 1.9 cm left ovarian corpus luteal cyst.  Trace free pelvic fluid is likely physiologic.  IMPRESSION: 1. 5 mm intrauterine gestational sac, most consistent with intrauterine pregnancy of 5 weeks 2 days. Lack of yolk sac, fetal pole, or cardiac activity, likely related to early gestational age. 2. Left ovarian corpus luteal cyst.   Electronically Signed   By: Jeronimo Greaves M.D.   On: 01/14/2015 15:12   US Ob Transvaginal  01/14/2015   CLINICAL DATA:  Vaginal bleeding for 1 day with cramping. Five weeks 0 days pregnant by last menstrual  EXAM: OBSTETRIC <14 WK [place] AND TRANSVAGINAL OB US  TECHNIQUE: Both transabdominal and transvaginal ultrasound examinations were performed for complete evaluation of the gestation as well as the maternal uterus, adnexal regions, and pelvic cul-de-sac. Transvaginal technique was performed to assess early pregnancy.  COMPARISON:  None.  FINDINGS: Intrauterine gestational sac: Visualized/normal in shape.  Yolk sac:  Not visualized  Embryo:  Not visualized  Cardiac Activity: Not visualized  MSD: 5  mm   5 w   2  d  Maternal uterus/adnexae: No subchorionic hemorrhage. Normal right ovary. 1.9 cm left ovarian corpus  luteal cyst.  Trace free pelvic fluid is likely physiologic.  IMPRESSION: 1. 5 mm intrauterine gestational sac, most consistent with intrauterine pregnancy of 5 weeks 2 days. Lack of yolk sac, fetal pole, or cardiac activity, likely related to early gestational age. 2. Left ovarian corpus luteal cyst.   Electronically Signed   By: Jeronimo Greaves M.D.   On: 01/14/2015 15:12     EKG Interpretation None      MDM   Final diagnoses:  Vaginal bleeding  Miscarriage, threatened, early pregnancy  Pregnancy   Patient presenting with vaginal bleeding and cramping yesterday and is [redacted] weeks pregnant. VSS. No complaints at this time. Pelvic exam with closed cervix no parts of conception visualized. No anemia. HCG under 1500. Patient is A+. No white blood cells on pelvic exam. Ultrasound with evidence of intrauterine pregnancy no fetal heart tones. Patient with evidence of urinary tract infection with elevated white blood cells and large leukocytes and many bacteria but is asymptomatic. We'll  treat with Keflex. [facility] as well as ultrasound. Patient has not had any prenatal care has been given prescription for prenatal vitamins.  Discussed return precautions with patient. Discussed all results and patient verbalizes understanding and agrees with plan.  Case has been discussed with Dr. Loretha StaplerWofford who agrees with the above plan and to discharge.      Oswaldo ConroyVictoria Errika Narvaiz, PA-C 01/14/15 1556  Blake DivineJohn Wofford, MD 01/15/15 [phone]

## 2015-01-14 NOTE — Discharge Instructions (Signed)
Return to the emergency room with worsening of symptoms, new symptoms or with symptoms that are concerning, , especially fevers, abdominal pain in one area, vaginal bleeding, abdominal cramping, unable to keep down fluids, blood in stool or vomit, severe pain, you feel faint, lightheaded or pass out. [facility] for recheck of hcg and ultrasound. Start taking prenatal vitamins, OTC is fine.  Keflex for urinary tract infection. Read below information and follow recommendations.  Threatened Miscarriage A threatened miscarriage occurs when you have vaginal bleeding during your first 20 weeks of pregnancy but the pregnancy has not ended. If you have vaginal bleeding during this time, your health care provider will do tests to make sure you are still pregnant. If the tests show you are still pregnant and the developing baby (fetus) inside your womb (uterus) is still growing, your condition is considered a threatened miscarriage. A threatened miscarriage does not mean your pregnancy will end, but it does increase the risk of losing your pregnancy (complete miscarriage). CAUSES  The cause of a threatened miscarriage is usually not known. If you go on to have a complete miscarriage, the most common cause is an abnormal number of chromosomes in the developing baby. Chromosomes are the structures inside cells that hold all your genetic material. Some causes of vaginal bleeding that do not result in miscarriage include:  Having sex.  Having an infection.  Normal hormone changes of pregnancy.  Bleeding that occurs when an egg implants in your uterus. RISK FACTORS Risk factors for bleeding in early pregnancy include:  Obesity.  Smoking.  Drinking excessive amounts of alcohol or caffeine.  Recreational drug use. SIGNS AND SYMPTOMS  Light vaginal bleeding.  Mild abdominal pain or cramps. DIAGNOSIS  If you have bleeding with or without abdominal pain before 20 weeks  of pregnancy, your health care provider will do tests to check whether you are still pregnant. One important test involves using sound waves and a computer (ultrasound) to create images of the inside of your uterus. Other tests include an internal exam of your vagina and uterus (pelvic exam) and measurement of your baby's heart rate.  You may be diagnosed with a threatened miscarriage if:  Ultrasound testing shows you are still pregnant.  Your baby's heart rate is strong.  A pelvic exam shows that the opening between your uterus and your vagina (cervix) is closed.  Your heart rate and blood pressure are stable.  Blood tests confirm you are still pregnant. TREATMENT  No treatments have been shown to prevent a threatened miscarriage from going on to a complete miscarriage. However, the right home care is important.  HOME CARE INSTRUCTIONS   Make sure you keep all your appointments for prenatal care. This is very important.  Get plenty of rest.  Do not have sex or use tampons if you have vaginal bleeding.  Do not douche.  Do not smoke or use recreational drugs.  Do not drink alcohol.  Avoid caffeine. SEEK MEDICAL CARE IF:  You have light vaginal bleeding or spotting while pregnant.  You have abdominal pain or cramping.  You have a fever. SEEK IMMEDIATE MEDICAL CARE IF:  You have heavy vaginal bleeding.  You have blood clots coming from your vagina.  You have severe low back pain or abdominal cramps.  You have fever, chills, and severe abdominal pain. MAKE SURE YOU:  Understand these instructions.  Will watch your condition.  Will get help right away if you are not  doing well or get worse. Document Released: 10/01/2005 Document Revised: 10/06/2013 Document Reviewed: 07/28/2013 [facility] Patient Information 2015 [employer] [place]. This information is not intended to replace advice given to you by your health care provider. Make sure you discuss any questions you have with  your health care provider.

## 2015-01-14 NOTE — ED Notes (Signed)
Per pt, states she took 2 pregnancy tests yesterday and they read positive-now having some bleeding

## 2015-01-14 NOTE — Progress Notes (Addendum)
CM spoke with pt who [place]. CM discussed and provided written information for self pay pcps, importance of pcp for f/u care, www.needymeds.org, www.goodrx.com, discounted pharmacies and other [place] resources such as [facility], [facility], affordable care act,  financial assistance, DSS and  health department  Reviewed resources for [place] self pay pcps like [facility] [place], family medicine at [address], [facility], general medical clinics, [facility] plus others, medication resources, [facility] out patient pharmacies and housing Pt voiced understanding and appreciation of resources provided   Provided [facility] contact information Pt agreed to referral Referral completed   Offered  Please follow up with list of [place] self pay uninsured provider given to you by case manager As needed providers on the list for follow up care after leaving the emergency room [facility] another resource you have available [facility] [address] [phone] [facility]  [place] is another resource you have available [facility]  [place] [facility] [address], [place], [place] [address] [phone]

## 2015-01-17 ENCOUNTER — Telehealth: Payer: Self-pay | Admitting: *Deleted

## 2015-01-17 ENCOUNTER — Other Ambulatory Visit: Payer: Medicaid Other

## 2015-01-17 DIAGNOSIS — O209 Hemorrhage in early pregnancy, unspecified: Secondary | ICD-10-CM

## 2015-01-17 LAB — GC/CHLAMYDIA PROBE AMP (~~LOC~~) NOT AT ARMC
Chlamydia: NEGATIVE
Neisseria Gonorrhea: NEGATIVE

## 2015-01-17 NOTE — Telephone Encounter (Signed)
Pt contacted clinic for BHCG check and ultrasound.  Pt to come into the clinic today for lab and depending on results will schedule ultrasound.  Pt verbalizes understanding.

## 2015-01-18 ENCOUNTER — Telehealth: Payer: Self-pay

## 2015-01-18 DIAGNOSIS — O3680X1 Pregnancy with inconclusive fetal viability, fetus 1: Secondary | ICD-10-CM

## 2015-01-18 LAB — HCG, QUANTITATIVE, PREGNANCY: hCG, Beta Chain, Quant, S: 4374.3 m[IU]/mL

## 2015-01-18 NOTE — Telephone Encounter (Signed)
Viability scan scheduled for 01/24/15 at 1115. Called patient and informed her of appointment date, time and location and advised she arrive at 1100. Informed her she will be called with results after U/S but advised she call clinic for results if she hasn't heard within a few days. Patient verbalized understanding and gratitude. No questions or concerns.

## 2015-01-18 NOTE — Telephone Encounter (Signed)
-----   Message from Catalina AntiguaPeggy Constant, MD sent at 01/18/2015  2:18 PM EDT ----- Please schedule viability scan 10 days from 01/14/2015. Help patient establish prenatal care post ultrasound  Thanks  Peggy

## 2015-01-24 ENCOUNTER — Telehealth: Payer: Self-pay

## 2015-01-24 ENCOUNTER — Ambulatory Visit (HOSPITAL_COMMUNITY)
Admission: RE | Admit: 2015-01-24 | Discharge: 2015-01-24 | Disposition: A | Discharge status: Home / Self Care | Payer: Medicaid Other | Source: Ambulatory Visit | Attending: Obstetrics and Gynecology | Admitting: Obstetrics and Gynecology

## 2015-01-24 DIAGNOSIS — Z3A01 Less than 8 weeks gestation of pregnancy: Secondary | ICD-10-CM | POA: Insufficient documentation

## 2015-01-24 DIAGNOSIS — O208 Other hemorrhage in early pregnancy: Secondary | ICD-10-CM | POA: Diagnosis not present

## 2015-01-24 DIAGNOSIS — O3680X1 Pregnancy with inconclusive fetal viability, fetus 1: Secondary | ICD-10-CM

## 2015-01-24 DIAGNOSIS — O284 Abnormal radiological finding on antenatal screening of mother: Secondary | ICD-10-CM | POA: Insufficient documentation

## 2015-01-24 NOTE — Telephone Encounter (Signed)
Patient walked into clinic requesting results from U/S today. Reviewed U/S which shows IUP 2147w2d. Informed patient she may seek PNC. Patient requests letter for medicaid and states she will schedule appointment with [facility]. Letter given.

## 2015-02-22 ENCOUNTER — Other Ambulatory Visit (HOSPITAL_COMMUNITY): Payer: Self-pay | Admitting: Nurse Practitioner

## 2015-02-22 DIAGNOSIS — Z3682 Encounter for antenatal screening for nuchal translucency: Secondary | ICD-10-CM

## 2015-03-08 ENCOUNTER — Ambulatory Visit (HOSPITAL_COMMUNITY)
Admission: RE | Admit: 2015-03-08 | Discharge: 2015-03-08 | Disposition: A | Discharge status: Home / Self Care | Payer: Medicaid Other | Source: Ambulatory Visit | Attending: Nurse Practitioner | Admitting: Nurse Practitioner

## 2015-03-08 ENCOUNTER — Encounter (HOSPITAL_COMMUNITY): Payer: Self-pay

## 2015-03-08 DIAGNOSIS — O99211 Obesity complicating pregnancy, first trimester: Secondary | ICD-10-CM | POA: Diagnosis not present

## 2015-03-08 DIAGNOSIS — Z3A12 12 weeks gestation of pregnancy: Secondary | ICD-10-CM | POA: Diagnosis not present

## 2015-03-08 DIAGNOSIS — Z3682 Encounter for antenatal screening for nuchal translucency: Secondary | ICD-10-CM | POA: Insufficient documentation

## 2015-03-08 DIAGNOSIS — Z36 Encounter for antenatal screening of mother: Secondary | ICD-10-CM | POA: Diagnosis present

## 2015-03-15 ENCOUNTER — Other Ambulatory Visit (HOSPITAL_COMMUNITY): Payer: Self-pay | Admitting: Obstetrics and Gynecology

## 2015-04-25 ENCOUNTER — Other Ambulatory Visit (HOSPITAL_COMMUNITY): Payer: Self-pay | Admitting: Nurse Practitioner

## 2015-04-25 DIAGNOSIS — Z3689 Encounter for other specified antenatal screening: Secondary | ICD-10-CM

## 2015-04-25 DIAGNOSIS — Z3A19 19 weeks gestation of pregnancy: Secondary | ICD-10-CM

## 2015-04-25 DIAGNOSIS — O99212 Obesity complicating pregnancy, second trimester: Secondary | ICD-10-CM

## 2015-04-26 ENCOUNTER — Ambulatory Visit (HOSPITAL_COMMUNITY)
Admission: RE | Admit: 2015-04-26 | Discharge: 2015-04-26 | Disposition: A | Discharge status: Home / Self Care | Payer: Medicaid Other | Source: Ambulatory Visit | Attending: Nurse Practitioner | Admitting: Nurse Practitioner

## 2015-04-26 DIAGNOSIS — Z36 Encounter for antenatal screening of mother: Secondary | ICD-10-CM | POA: Insufficient documentation

## 2015-04-26 DIAGNOSIS — O99210 Obesity complicating pregnancy, unspecified trimester: Secondary | ICD-10-CM | POA: Insufficient documentation

## 2015-04-26 DIAGNOSIS — Z3A19 19 weeks gestation of pregnancy: Secondary | ICD-10-CM | POA: Diagnosis not present

## 2015-04-26 DIAGNOSIS — O99212 Obesity complicating pregnancy, second trimester: Secondary | ICD-10-CM | POA: Insufficient documentation

## 2015-04-26 DIAGNOSIS — E669 Obesity, unspecified: Secondary | ICD-10-CM | POA: Insufficient documentation

## 2015-04-26 DIAGNOSIS — Z3689 Encounter for other specified antenatal screening: Secondary | ICD-10-CM | POA: Insufficient documentation

## 2015-09-01 ENCOUNTER — Other Ambulatory Visit (HOSPITAL_COMMUNITY): Payer: Self-pay | Admitting: Urology

## 2015-09-01 DIAGNOSIS — Z3A38 38 weeks gestation of pregnancy: Secondary | ICD-10-CM

## 2015-09-01 DIAGNOSIS — O99213 Obesity complicating pregnancy, third trimester: Secondary | ICD-10-CM

## 2015-09-09 ENCOUNTER — Ambulatory Visit (HOSPITAL_COMMUNITY)
Admission: RE | Admit: 2015-09-09 | Discharge: 2015-09-09 | Disposition: A | Discharge status: Home / Self Care | Payer: Medicaid Other | Source: Ambulatory Visit | Attending: Physician Assistant | Admitting: Physician Assistant

## 2015-09-09 DIAGNOSIS — O99213 Obesity complicating pregnancy, third trimester: Secondary | ICD-10-CM | POA: Diagnosis present

## 2015-09-09 DIAGNOSIS — Z3A38 38 weeks gestation of pregnancy: Secondary | ICD-10-CM

## 2015-09-09 DIAGNOSIS — Z3A39 39 weeks gestation of pregnancy: Secondary | ICD-10-CM | POA: Diagnosis not present

## 2015-09-18 ENCOUNTER — Inpatient Hospital Stay (HOSPITAL_COMMUNITY): Payer: Medicaid Other | Admitting: Anesthesiology

## 2015-09-18 ENCOUNTER — Encounter (HOSPITAL_COMMUNITY): Payer: Self-pay | Admitting: *Deleted

## 2015-09-18 ENCOUNTER — Inpatient Hospital Stay (HOSPITAL_COMMUNITY)
Admission: AD | Admit: 2015-09-18 | Discharge: 2015-09-20 | DRG: 775 | Disposition: A | Discharge status: Home / Self Care | Payer: Medicaid Other | Source: Ambulatory Visit | Attending: Obstetrics & Gynecology | Admitting: Obstetrics & Gynecology

## 2015-09-18 DIAGNOSIS — O480 Post-term pregnancy: Secondary | ICD-10-CM | POA: Diagnosis present

## 2015-09-18 DIAGNOSIS — Z3A40 40 weeks gestation of pregnancy: Secondary | ICD-10-CM

## 2015-09-18 DIAGNOSIS — O99210 Obesity complicating pregnancy, unspecified trimester: Secondary | ICD-10-CM | POA: Diagnosis present

## 2015-09-18 DIAGNOSIS — Z6841 Body Mass Index (BMI) 40.0 and over, adult: Secondary | ICD-10-CM

## 2015-09-18 DIAGNOSIS — O99214 Obesity complicating childbirth: Secondary | ICD-10-CM | POA: Diagnosis present

## 2015-09-18 DIAGNOSIS — O99212 Obesity complicating pregnancy, second trimester: Secondary | ICD-10-CM | POA: Diagnosis present

## 2015-09-18 LAB — CBC
HEMATOCRIT: 35.1 % — AB (ref 36.0–46.0)
Hemoglobin: 11.4 g/dL — ABNORMAL LOW (ref 12.0–15.0)
MCH: 24.4 pg — AB (ref 26.0–34.0)
MCHC: 32.5 g/dL (ref 30.0–36.0)
MCV: 75 fL — AB (ref 78.0–100.0)
PLATELETS: 234 10*3/uL (ref 150–400)
RBC: 4.68 MIL/uL (ref 3.87–5.11)
RDW: 15.6 % — AB (ref 11.5–15.5)
WBC: 5.6 10*3/uL (ref 4.0–10.5)

## 2015-09-18 LAB — ABO/RH: ABO/RH(D): A POS

## 2015-09-18 LAB — TYPE AND SCREEN
ABO/RH(D): A POS
Antibody Screen: NEGATIVE

## 2015-09-18 MED ORDER — OXYCODONE-ACETAMINOPHEN 5-325 MG PO TABS: 1.0000 | ORAL_TABLET | ORAL | Status: DC | PRN

## 2015-09-18 MED ORDER — OXYTOCIN 40 UNITS IN LACTATED RINGERS INFUSION - SIMPLE MED
1.0000 m[IU]/min | INTRAVENOUS | Status: DC
  Administered 2015-09-18: 4 m[IU]/min via INTRAVENOUS

## 2015-09-18 MED ORDER — LIDOCAINE HCL (PF) 1 % IJ SOLN: 30.0000 mL | INTRAMUSCULAR | Status: DC | PRN

## 2015-09-18 MED ORDER — SIMETHICONE 80 MG PO CHEW: 80.0000 mg | CHEWABLE_TABLET | ORAL | Status: DC | PRN

## 2015-09-18 MED ORDER — FLEET ENEMA 7-19 GM/118ML RE ENEM: 1.0000 | ENEMA | RECTAL | Status: DC | PRN

## 2015-09-18 MED ORDER — DIPHENHYDRAMINE HCL 50 MG/ML IJ SOLN: 12.5000 mg | INTRAMUSCULAR | Status: DC | PRN

## 2015-09-18 MED ORDER — OXYTOCIN 40 UNITS IN LACTATED RINGERS INFUSION - SIMPLE MED: 62.5000 mL/h | INTRAVENOUS | Status: DC | PRN

## 2015-09-18 MED ORDER — LACTATED RINGERS IV SOLN
500.0000 mL | INTRAVENOUS | Status: DC | PRN
  Administered 2015-09-18: 500 mL via INTRAVENOUS

## 2015-09-18 MED ORDER — PHENYLEPHRINE 40 MCG/ML (10ML) SYRINGE FOR IV PUSH (FOR BLOOD PRESSURE SUPPORT)
80.0000 ug | PREFILLED_SYRINGE | INTRAVENOUS | Status: DC | PRN
  Filled 2015-09-18: qty 2
  Filled 2015-09-18: qty 20

## 2015-09-18 MED ORDER — PRENATAL MULTIVITAMIN CH
1.0000 | ORAL_TABLET | Freq: Every day | ORAL | Status: DC
  Administered 2015-09-19 – 2015-09-20 (×2): 1 via ORAL
  Filled 2015-09-18 (×2): qty 1

## 2015-09-18 MED ORDER — TETANUS-DIPHTH-ACELL PERTUSSIS 5-2.5-18.5 LF-MCG/0.5 IM SUSP: 0.5000 mL | Freq: Once | INTRAMUSCULAR | Status: DC

## 2015-09-18 MED ORDER — ONDANSETRON HCL 4 MG PO TABS: 4.0000 mg | ORAL_TABLET | ORAL | Status: DC | PRN

## 2015-09-18 MED ORDER — ACETAMINOPHEN 325 MG PO TABS: 650.0000 mg | ORAL_TABLET | ORAL | Status: DC | PRN

## 2015-09-18 MED ORDER — LIDOCAINE HCL (PF) 1 % IJ SOLN
INTRAMUSCULAR | Status: AC
  Filled 2015-09-18: qty 30

## 2015-09-18 MED ORDER — LACTATED RINGERS IV SOLN: INTRAVENOUS | Status: DC

## 2015-09-18 MED ORDER — FENTANYL 2.5 MCG/ML BUPIVACAINE 1/10 % EPIDURAL INFUSION (WH - ANES)
14.0000 mL/h | INTRAMUSCULAR | Status: DC | PRN
Start: 2015-09-18 — End: 2015-09-20
  Filled 2015-09-18: qty 125

## 2015-09-18 MED ORDER — TERBUTALINE SULFATE 1 MG/ML IJ SOLN: 0.2500 mg | Freq: Once | INTRAMUSCULAR | Status: DC | PRN

## 2015-09-18 MED ORDER — ONDANSETRON HCL 4 MG/2ML IJ SOLN: 4.0000 mg | INTRAMUSCULAR | Status: DC | PRN

## 2015-09-18 MED ORDER — LIDOCAINE HCL (PF) 1 % IJ SOLN
INTRAMUSCULAR | Status: DC | PRN
  Administered 2015-09-18 (×2): 5 mL

## 2015-09-18 MED ORDER — SODIUM CHLORIDE 0.9 % IJ SOLN: 3.0000 mL | Freq: Two times a day (BID) | INTRAMUSCULAR | Status: DC

## 2015-09-18 MED ORDER — OXYCODONE-ACETAMINOPHEN 5-325 MG PO TABS: 2.0000 | ORAL_TABLET | ORAL | Status: DC | PRN

## 2015-09-18 MED ORDER — CITRIC ACID-SODIUM CITRATE 334-500 MG/5ML PO SOLN: 30.0000 mL | ORAL | Status: DC | PRN

## 2015-09-18 MED ORDER — OXYTOCIN 40 UNITS IN LACTATED RINGERS INFUSION - SIMPLE MED: 62.5000 mL/h | INTRAVENOUS | Status: DC

## 2015-09-18 MED ORDER — WITCH HAZEL-GLYCERIN EX PADS: 1.0000 "application " | MEDICATED_PAD | CUTANEOUS | Status: DC | PRN

## 2015-09-18 MED ORDER — FENTANYL 2.5 MCG/ML BUPIVACAINE 1/10 % EPIDURAL INFUSION (WH - ANES)
14.0000 mL/h | INTRAMUSCULAR | Status: DC | PRN
Start: 2015-09-18 — End: 2015-09-18

## 2015-09-18 MED ORDER — ZOLPIDEM TARTRATE 5 MG PO TABS: 5.0000 mg | ORAL_TABLET | Freq: Every evening | ORAL | Status: DC | PRN

## 2015-09-18 MED ORDER — SODIUM CHLORIDE 0.9 % IJ SOLN: 3.0000 mL | INTRAMUSCULAR | Status: DC | PRN

## 2015-09-18 MED ORDER — ONDANSETRON HCL 4 MG/2ML IJ SOLN: 4.0000 mg | Freq: Four times a day (QID) | INTRAMUSCULAR | Status: DC | PRN

## 2015-09-18 MED ORDER — OXYCODONE-ACETAMINOPHEN 5-325 MG PO TABS
1.0000 | ORAL_TABLET | ORAL | Status: DC | PRN
  Administered 2015-09-18: 1 via ORAL
  Filled 2015-09-18: qty 1

## 2015-09-18 MED ORDER — EPHEDRINE 5 MG/ML INJ
10.0000 mg | INTRAVENOUS | Status: DC | PRN
  Filled 2015-09-18: qty 2

## 2015-09-18 MED ORDER — MEASLES, MUMPS & RUBELLA VAC ~~LOC~~ INJ
0.5000 mL | INJECTION | Freq: Once | SUBCUTANEOUS | Status: DC
  Filled 2015-09-18: qty 0.5

## 2015-09-18 MED ORDER — LANOLIN HYDROUS EX OINT: TOPICAL_OINTMENT | CUTANEOUS | Status: DC | PRN

## 2015-09-18 MED ORDER — OXYTOCIN BOLUS FROM INFUSION
500.0000 mL | INTRAVENOUS | Status: DC
  Administered 2015-09-18: 500 mL via INTRAVENOUS

## 2015-09-18 MED ORDER — IBUPROFEN 600 MG PO TABS
600.0000 mg | ORAL_TABLET | Freq: Four times a day (QID) | ORAL | Status: DC
  Administered 2015-09-18 – 2015-09-20 (×8): 600 mg via ORAL
  Filled 2015-09-18 (×8): qty 1

## 2015-09-18 MED ORDER — DIPHENHYDRAMINE HCL 25 MG PO CAPS: 25.0000 mg | ORAL_CAPSULE | Freq: Four times a day (QID) | ORAL | Status: DC | PRN

## 2015-09-18 MED ORDER — SENNOSIDES-DOCUSATE SODIUM 8.6-50 MG PO TABS
2.0000 | ORAL_TABLET | ORAL | Status: DC
  Administered 2015-09-18 – 2015-09-19 (×2): 2 via ORAL
  Filled 2015-09-18 (×2): qty 2

## 2015-09-18 MED ORDER — SODIUM CHLORIDE 0.9 % IV SOLN: 250.0000 mL | INTRAVENOUS | Status: DC | PRN

## 2015-09-18 MED ORDER — DIBUCAINE 1 % RE OINT: 1.0000 "application " | TOPICAL_OINTMENT | RECTAL | Status: DC | PRN

## 2015-09-18 MED ORDER — BENZOCAINE-MENTHOL 20-0.5 % EX AERO
1.0000 "application " | INHALATION_SPRAY | CUTANEOUS | Status: DC | PRN
  Administered 2015-09-18: 1 via TOPICAL
  Filled 2015-09-18: qty 56

## 2015-09-18 MED ORDER — INFLUENZA VAC SPLIT QUAD 0.5 ML IM SUSY
0.5000 mL | PREFILLED_SYRINGE | INTRAMUSCULAR | Status: AC
  Administered 2015-09-19: 0.5 mL via INTRAMUSCULAR

## 2015-09-18 MED ORDER — FENTANYL 2.5 MCG/ML BUPIVACAINE 1/10 % EPIDURAL INFUSION (WH - ANES)
INTRAMUSCULAR | Status: DC | PRN
  Administered 2015-09-18: 14 mL/h via EPIDURAL

## 2015-09-18 MED ORDER — OXYTOCIN 40 UNITS IN LACTATED RINGERS INFUSION - SIMPLE MED
INTRAVENOUS | Status: AC
  Filled 2015-09-18: qty 1000

## 2015-09-18 NOTE — H&P (Signed)
Jenny Carr is a 29 y.o. female presenting for Active labor pt 7 cm, GBS neg. History OB History    Gravida Para Term Preterm AB TAB SAB Ectopic Multiple Living   2 1 1       1      History reviewed. No pertinent past medical history. History reviewed. No pertinent past surgical history. Family History: family history is not on file. Social History:  reports that she has never smoked. She does not have any smokeless tobacco history on file. She reports that she does not drink alcohol or use illicit drugs.   Prenatal Transfer Tool  Maternal Diabetes: No Genetic Screening: Normal Maternal Ultrasounds/Referrals: Normal Fetal Ultrasounds or other Referrals:  None Maternal Substance Abuse:  No Significant Maternal Medications:  None Significant Maternal Lab Results:  None Other Comments:  None  Review of Systems  Constitutional: Negative.   HENT: Negative.   Eyes: Negative.   Respiratory: Negative.   Cardiovascular: Negative.   Gastrointestinal: Positive for abdominal pain.  Genitourinary: Negative.   Musculoskeletal: Negative.   Skin: Negative.   Neurological: Negative.   Endo/Heme/Allergies: Negative.   Psychiatric/Behavioral: Negative.     Dilation: 9 Effacement (%): 100 Station: -1 Exam by:: Lajuana MatteJacobs, Dina RN Blood pressure 129/91, pulse 86, temperature 97.8 F (36.6 C), temperature source Oral, resp. rate 20, height 5\' 1"  (1.549 m), weight 239 lb (108.41 kg), last menstrual period 12/10/2014, unknown if currently breastfeeding. Maternal Exam:  Uterine Assessment: Contraction strength is moderate.  Contraction frequency is regular.   Abdomen: Patient reports no abdominal tenderness. Fetal presentation: vertex  Introitus: Normal vulva. Normal vagina.  Amniotic fluid character: clear.  Pelvis: adequate for delivery.   Cervix: Cervix evaluated by digital exam.     Fetal Exam Fetal Monitor Review: Mode: ultrasound.   Variability: moderate (6-25 bpm).    Fetal  State Assessment: Category I - tracings are normal.     Physical Exam  Constitutional: She is oriented to person, place, and time. She appears well-developed and well-nourished.  HENT:  Head: Normocephalic.  Neck: Normal range of motion.  Cardiovascular: Normal rate, regular rhythm, normal heart sounds and intact distal pulses.   Respiratory: Effort normal and breath sounds normal.  GI: Soft. Bowel sounds are normal.  Genitourinary: Vagina normal and uterus normal.  Musculoskeletal: Normal range of motion.  Neurological: She is alert and oriented to person, place, and time. She has normal reflexes.  Skin: Skin is warm and dry.  Psychiatric: She has a normal mood and affect. Her behavior is normal. Judgment and thought content normal.    Prenatal labs: ABO, Rh: --/--/A POS (04/01 1300) Antibody:   Rubella:   RPR:    HBsAg:    HIV:    GBS:     Assessment/Plan: Active labor will admit    Jenny Carr 09/18/2015, 10:36 AM

## 2015-09-18 NOTE — Anesthesia Preprocedure Evaluation (Signed)
Anesthesia Evaluation  Patient identified by MRN, date of birth, ID band Patient awake and Patient confused    Reviewed: Allergy & Precautions, H&P , NPO status , Patient's Chart, lab work & pertinent test results  Airway Mallampati: II       Dental   Pulmonary    Pulmonary exam normal breath sounds clear to auscultation       Cardiovascular Exercise Tolerance: Good Normal cardiovascular exam Rhythm:regular Rate:Normal     Neuro/Psych    GI/Hepatic   Endo/Other  Morbid obesityBMI45  Renal/GU      Musculoskeletal   Abdominal   Peds  Hematology   Anesthesia Other Findings   Reproductive/Obstetrics (+) Pregnancy                             Anesthesia Physical Anesthesia Plan  ASA: III  Anesthesia Plan:    Post-op Pain Management:    Induction:   Airway Management Planned:   Additional Equipment:   Intra-op Plan:   Post-operative Plan:   Informed Consent:   Plan Discussed with:   Anesthesia Plan Comments:         Anesthesia Quick Evaluation

## 2015-09-18 NOTE — MAU Note (Signed)
Sharp pains started early this morning, now having UC's every 3 min.  No bleeding or leaking. Was 2 cm when last checked.

## 2015-09-18 NOTE — Anesthesia Postprocedure Evaluation (Signed)
Anesthesia Post Note  Patient: Jenny Carr  Procedure(s) Performed: * No procedures listed *  Patient location during evaluation: Mother Baby Anesthesia Type: Epidural Level of consciousness: awake and alert Pain management: pain level controlled Vital Signs Assessment: post-procedure vital signs reviewed and stable Respiratory status: spontaneous breathing Cardiovascular status: stable Postop Assessment: no headache and no backache Anesthetic complications: no    Last Vitals:  Filed Vitals:   09/18/15 1345 09/18/15 1415  BP: 139/65 135/85  Pulse: 73 74  Temp:  36.9 C  Resp: 18 20    Last Pain:  Filed Vitals:   09/18/15 1518  PainSc: 0-No pain                 Anakin Varkey

## 2015-09-18 NOTE — Lactation Note (Signed)
This note was copied from the chart of Jenny Carr. Lactation Consultation Note  Patient Name: Jenny Carr ZOXWR'UToday's Date: 09/18/2015 Reason for consult: Initial assessment   Initial consult at 9 hrs; GA 40.2; BW 8 lbs, 4.3 oz.  BF experienced with first child for 2 weeks.  Mom obese.  Suspected R clavicle fracture; X-Ray done this evening revealing no fracture per chart.   Infant has breastfed attempt only x1 (0 min) + formula x1 (8 ml) since birth in 8 hrs; voids-3; stools-1 since birth in 8 hrs. Mom has large soft breast tissue, compressible areolas, erect nipples.   Infant STS with mom when LC entered room.  Attempted waking infant.  Infant began to squirm and cry but when put at breast infant would not latch and went back to sleep. Hand expression taught with return demonstration.  Colostrum glistened on nipple tip but could not HE enough to form a drop. Curved-tip syringe, colostrum collection containers, and spoons given for EBM supplementation.  Verbally demonstrated to parents how to spoon feed. Educated on feeding cues, cluster feeding, and size of infant's stomach.  Encouraged to watch for feeding cues for latching. Encouraged to breastfeed prior to offering formula.  Reviewed supply and demand. Lactation brochure given and informed of hospital support group and outpatient services.  Encouraged to call for assistance with next latch.    Maternal Data Has patient been taught Hand Expression?: Yes Does the patient have breastfeeding experience prior to this delivery?: Yes  Feeding Feeding Type: Breast Fed Length of feed: 0 min  LATCH Score/Interventions Latch: Too sleepy or reluctant, no latch achieved, no sucking elicited.     Type of Nipple: Everted at rest and after stimulation  Comfort (Breast/Nipple): Soft / non-tender     Hold (Positioning): Assistance needed to correctly position infant at breast and maintain latch.      Consult Status Consult  Status: Follow-up Date: 09/19/15 Follow-up type: In-patient    Lendon KaVann, [name] 09/18/2015, 9:50 PM

## 2015-09-18 NOTE — Anesthesia Procedure Notes (Signed)
Epidural Patient location during procedure: OB Start time: 09/18/2015 10:28 AM End time: 09/18/2015 10:45 AM  Staffing Anesthesiologist: Sebastian AcheMANNY, Jianni Shelden  Preanesthetic Checklist Completed: patient identified, site marked, surgical consent, pre-op evaluation, timeout performed, IV checked, risks and benefits discussed and monitors and equipment checked  Epidural Patient position: sitting Prep: site prepped and draped and DuraPrep Patient monitoring: heart rate, continuous pulse ox and blood pressure Approach: midline Location: L3-L4 Injection technique: LOR air  Needle:  Needle type: Tuohy  Needle gauge: 17 G Needle length: 9 cm and 9 Needle insertion depth: 7 cm Catheter type: closed end flexible Catheter size: 19 Gauge Catheter at skin depth: 15 cm Test dose: negative and 1.5% lidocaine with Epi 1:200 K  Assessment Events: blood not aspirated, injection not painful, no injection resistance, negative IV test and no paresthesia  Additional Notes   Patient tolerated the insertion well without complications.Reason for block:procedure for pain

## 2015-09-19 LAB — RPR: RPR Ser Ql: NONREACTIVE

## 2015-09-19 NOTE — Lactation Note (Signed)
This note was copied from the chart of Jenny Eliezer Mccoyiffany Wegner. Lactation Consultation Note Mom called out for assistance for latching. Baby sleepy not interested. Abd.slightly distended, having a stool. Discussed positions, STS, using props, and comfort for baby and mom. Mom has large pendulum breast w/everted large nipples. Encouraged to roll nipples in finger tips to firm before latching. Encouraged mom to put rolled cloth under breast to give support and lift up before BF. Encouraged to use "C" hold to firm and guide breast and nipple into baby's mouth. Mom has WIC and encouraged to go to OP appt.after D/C home if need any assistance and [facility]. Patient Name: Jenny Carr ZOXWR'UToday's Date: 09/19/2015 Reason for consult: Follow-up assessment   Maternal Data Has patient been taught Hand Expression?: Yes Does the patient have breastfeeding experience prior to this delivery?: Yes  Feeding Feeding Type: Formula Length of feed:  (few sucks)  LATCH Score/Interventions Latch: Too sleepy or reluctant, no latch achieved, no sucking elicited.     Type of Nipple: Everted at rest and after stimulation  Comfort (Breast/Nipple): Soft / non-tender     Hold (Positioning): Assistance needed to correctly position infant at breast and maintain latch. Intervention(s): Position options;Support Pillows;Skin to skin;Breastfeeding basics reviewed     Lactation Tools Discussed/Used     Consult Status Consult Status: Follow-up Date: 09/19/15 Follow-up type: In-patient    [name], Diamond NickelLAURA G 09/19/2015, 1:21 AM

## 2015-09-19 NOTE — Progress Notes (Cosign Needed)
Post Partum Day 1  Subjective:  Jenny Carr is a 29 y.o. [id number] 6422w2d s/p VAVD due fetal bradycardia  No acute events overnight.  Pt denies problems with ambulating, voiding or po intake.  She denies nausea or vomiting.  Pain is well controlled.  She has had flatus. She has not had bowel movement.  Lochia Minimal.  Plan for birth control is undecided .  Method of Feeding: breast and bottle   Objective: BP 117/69 mmHg  Pulse 78  Temp(Src) 97.6 F (36.4 C) (Oral)  Resp 18  Ht 5\' 1"  (1.549 m)  Wt 239 lb (108.41 kg)  BMI 45.18 kg/m2  SpO2 100%  LMP 12/10/2014  Breastfeeding? Unknown  Physical Exam:  General: alert, cooperative and no distress Lochia:normal flow Chest: CTAB Heart: RRR no m/r/g Abdomen: +BS, soft, nontender, fundus firm at/below umbilicus Uterine Fundus: firm,  DVT Evaluation: No evidence of DVT seen on physical exam. Extremities: no lower extremity edema   Recent Labs  09/18/15 0945  HGB 11.4*  HCT 35.1*    Assessment/Plan:  ASSESSMENT: Jenny Blancoiffany S Linquist is a 29 y.o. G2P2002 5322w2d ppd #1  S/p VAVD doing well.   Plan for discharge tomorrow   LOS: 1 day   Deaundra Dupriest Z Sharlett Lienemann 09/19/2015, 6:15 AM

## 2015-09-20 MED ORDER — IBUPROFEN 600 MG PO TABS: 600.0000 mg | ORAL_TABLET | Freq: Four times a day (QID) | ORAL | Status: DC

## 2015-09-20 NOTE — Discharge Instructions (Signed)

## 2015-09-20 NOTE — Lactation Note (Signed)
This note was copied from the chart of Jenny Carr. Lactation Consultation Note: Mother has been breastfeeding with feeding cues. She states that her nipples got very sore due to infant has a strong latch. Mother states that pain scale was a # 10 with initial latch. Mother states that she began to bottle feed formula. Mother state that she has a hand pump and plans to pump after feedings . Mother's nipples are large but no noted trauma .  Mother was fit with a # 24 nipple shield to bridge infant back to breast. Infant latched to mother's right breast with good depth. Infant was given 10 ml of formula through the nipple shield. Infant sustained latch for 20-25 mins. Advised mother to observed for milk in the shield. Handout on proper application of nipple shield given and advised mother to use only as needed. Recommend that parents avoid using a pacifier due to nipple confusion.  Mother to continue to breast feed 8-12 times in 24 hours and with all feeding cues. Discussed treatment to prevent engorgement. [facility]. Mother is not active with WIC.   Patient Name: Jenny Eliezer Mccoyiffany Quinnell ZOXWR'UToday's Date: 09/20/2015 Reason for consult: Follow-up assessment   Maternal Data    Feeding Feeding Type: Formula Nipple Type: Slow - flow  LATCH Score/Interventions                      Lactation Tools Discussed/Used     Consult Status      Michel BickersKendrick, Lavontay Kirk McCoy 09/20/2015, 10:34 AM

## 2015-09-20 NOTE — Lactation Note (Signed)
This note was copied from the chart of Jenny Carr. Lactat   into rm. D/t baby extremely fussy and will not BF. Moms nipples are sore. Baby had been BF every 2 hours earlier and now will not latch. Baby would get on nipple and clamp down and whine, wouldn't suck on breast unless you try to take the nipple out of mouth, then it would only be a couple of times. Mom stated baby had BF well during the day. Burped baby to see if it was gas. Didn't burp, diaper dry. Tried to hand express breast and only a drop from Rt. Breast, none expressed from Lt. Breast. W/gloved finger baby would suckle on finger. Gave baby Alimentum in syring w/gloved finger. Burped baby then baby laid quietly. Explained also baby could be sore or uncomfortable from clavicle or head soreness. Baby was clamping on moms nipple instead of suckling and whining. Mom stated she thinks she might have to just bottle feed d/t painful latching and soreness of nipples. Discussed options, mom stated she would like to pump and bottle feed. W/hand expression unable to obtain colostrum, and might have to supplement until milk comes in. Discussed the importance of pumping regularly on a schedule, discussed supply and demand. Encouraged mom to give it more time d/t baby was learning as well and has had challenges. Gave hand pump and Mom shown how to use DEBP & how to disassemble, clean, & reassemble parts.Mom knows to pump q3h for 15-20 min. Gave comfort gels for sore tender nipples.  Patient Name: Jenny Carr   ZOXWR'UToday's Date: 09/20/2015 Reason for consult: Follow-up assessment;Breast/nipple pain   Maternal Data    Feeding Feeding Type: Formula Nipple Type: Slow - flow Length of feed: 0 min  LATCH Score/Interventions Latch: Too sleepy or reluctant, no latch achieved, no sucking elicited.  Audible Swallowing: None Intervention(s): Hand expression  Type of Nipple: Everted at rest and after stimulation  Comfort (Breast/Nipple):  Filling, red/small blisters or bruises, mild/mod discomfort  Problem noted: Mild/Moderate discomfort Interventions (Mild/moderate discomfort): Comfort gels;Post-pump;Hand massage;Hand expression  Hold (Positioning): Assistance needed to correctly position infant at breast and maintain latch. Intervention(s): Position options;Support Pillows;Breastfeeding basics reviewed  LATCH Score: 4  Lactation Tools Discussed/Used Tools: Pump;Comfort gels Breast pump type: Double-Electric Breast Pump WIC Program: Yes Pump Review: Setup, frequency, and cleaning;Milk Storage Initiated by:: Peri JeffersonL. Jaryan Chicoine RN Date initiated:: 09/20/15   Consult Status Consult Status: Follow-up Date: 09/20/15 Follow-up type: In-patient    Charyl DancerCARVER, Ifeanyi Mickelson G 09/20/2015, 3:14 AM

## 2015-09-20 NOTE — Discharge Summary (Signed)
OB Discharge Summary     Patient Name: Jenny Carr DOB: 01/29/1986 MRN: [id number]  Date of admission: 09/18/2015 Delivering MD: Wyvonnia DuskyLAWSON, MARIE D   Date of discharge: 09/20/2015  Admitting diagnosis: 40WKS 3 MINS  Intrauterine pregnancy: 1373w2d     Secondary diagnosis:  Active Problems:   SVD (spontaneous vaginal delivery)   Obesity complicating pregnancy in second trimester   Indication for care in labor or delivery  Additional problems: None     Discharge diagnosis: Term Pregnancy Delivered                                                                                                Post partum procedures:None   Augmentation: None  Complications: None  Hospital course:  Onset of Labor With Vaginal Delivery     29 y.o. yo Jenny Carr at 5473w2d was admitted in Active Laboron 09/18/2015. Patient had an uncomplicated labor course as follows: Patient delivered via Kiwi vacuum delivery due to fetal bradycardia  Membrane Rupture Time/Date: 9:39 AM ,09/18/2015   Intrapartum Procedures: Episiotomy: None [1]                                         Lacerations:  1st degree [2]  Patient had a delivery of a Viable infant. 09/18/2015  Information for the patient's newborn:  Jenny Carr, Jenny Carr [[id number]][[id number]]  Delivery Method: Vag-Spont    Pateint had an uncomplicated postpartum course.  She is ambulating, tolerating a regular diet, passing flatus, and urinating well. Patient is discharged home in stable condition on 09/20/2015.    Physical exam  Filed Vitals:   09/19/15 0853 09/19/15 1312 09/19/15 1850 09/20/15 0545  BP: 110/63 113/55 117/64 112/61  Pulse: 70 74 70 61  Temp: 97.6 F (36.4 C) 97.2 F (36.2 C) 98 F (36.7 C) 97.9 F (36.6 C)  TempSrc:  Oral Oral Oral  Resp: 18 16 18 18   Height:      Weight:      SpO2: 97% 99%     General: alert, cooperative and no distress Lochia: appropriate Uterine Fundus: firm Incision: N/A DVT Evaluation: No evidence of DVT seen on  physical exam. Labs: Lab Results  Component Value Date   WBC 5.6 09/18/2015   HGB 11.4* 09/18/2015   HCT 35.1* 09/18/2015   MCV 75.0* 09/18/2015   PLT 234 09/18/2015   CMP Latest Ref Rng 01/14/2015  Glucose 70 - 99 mg/dL 95  BUN 6 - 23 mg/dL 8  Creatinine 6.570.50 - 8.461.10 mg/dL 9.620.60  Sodium 952135 - 841145 mmol/L 138  Potassium 3.5 - 5.1 mmol/L 3.6  Chloride 96 - 112 mmol/L 102    Discharge instruction: per After Visit Summary and "Baby and Me Booklet".  After visit meds:    Medication List    ASK your doctor about these medications        cephALEXin 500 MG capsule  Commonly known as:  KEFLEX  Take 1 capsule (500 mg total) by mouth 4 (four) times daily.  PRENATAL COMPLETE 14-0.4 MG Tabs  Take 4 mg by mouth daily.        Diet: routine diet  Activity: Advance as tolerated. Pelvic rest for 6 weeks.   Outpatient follow up:6 weeks Follow up Appt:No future appointments. Follow up Visit:No Follow-up on file.  Postpartum contraception: Progesterone only pills  Newborn Data: Live born female  Birth Weight: 8 lb 4.3 oz (3751 g) APGAR: 8, 9  Baby Feeding: Bottle and Breast Disposition:home with mother   09/20/2015 Jenny Maiers, MD  OB fellow attestation I have seen and examined this patient and agree with above documentation in the resident's note.   Jenny Carr is a 29 y.o. Z6X0960 s/p VAVD.   Pain is well controlled.  Plan for birth control is oral progesterone-only contraceptive.  Method of Feeding: Breast and formula  PE:  BP 112/61 mmHg  Pulse 61  Temp(Src) 97.9 F (36.6 C) (Oral)  Resp 18  Ht  (1.549 m)  Wt 239 lb (108.41 kg)  BMI 45.18 kg/m2  SpO2 99%  LMP 12/10/2014  Breastfeeding? Unknown Gen: well appearing Heart: reg rate Lungs: normal WOB Fundus firm Ext: soft, no pain, no edema   Recent Labs  09/18/15 0945  HGB 11.4*  HCT 35.1*   Plan: discharge today - postpartum care discussed - f/u clinic in 6 weeks for postpartum  visit   Jenny Flake, MD 8:56 AM

## 2015-12-12 IMAGING — US US OB TRANSVAGINAL
1 series · 14 of 28 positions shown · non-contrast
Comparison: 01/14/2015.

CLINICAL DATA: Inconclusive fetal viability

EXAM:
OBSTETRIC <14 WK US AND TRANSVAGINAL OB US
TECHNIQUE: Both transabdominal and transvaginal ultrasound examinations were
performed for complete evaluation of the gestation as well as the
maternal uterus, adnexal regions, and pelvic cul-de-sac.
Transvaginal technique was performed to assess early pregnancy.

[Series 1: us ob transvaginal · 46 acquisitions, 14 frames shown]
[im 2/46]
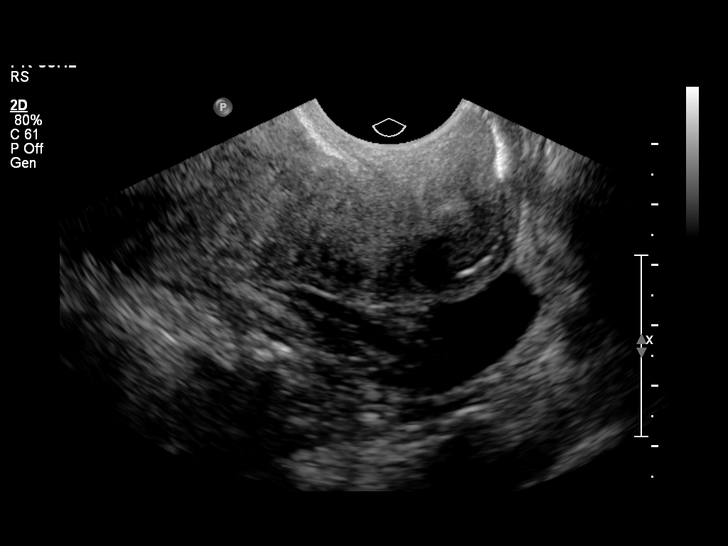
[im 6/46]
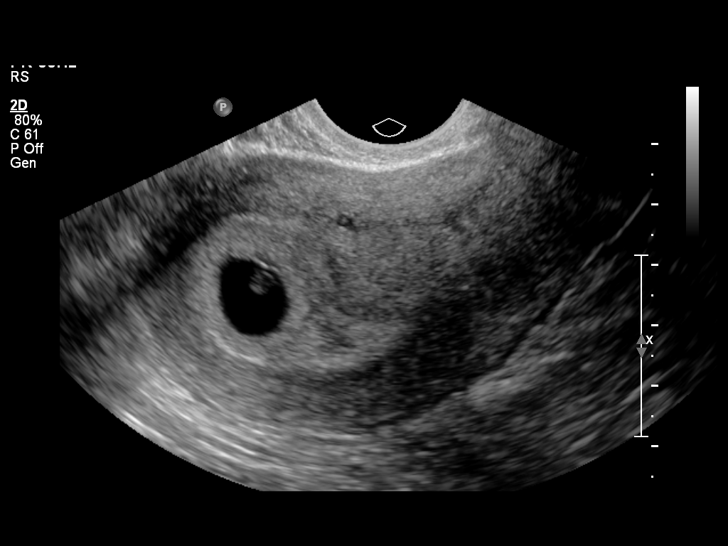
[im 9/46]
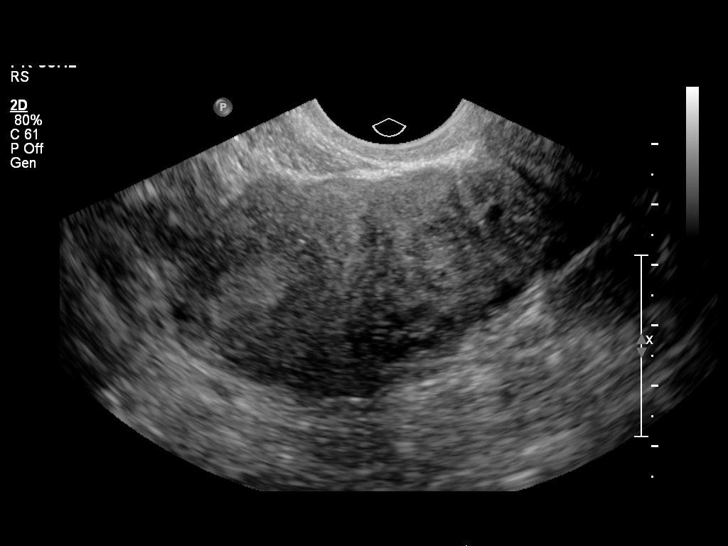
[im 12/46]
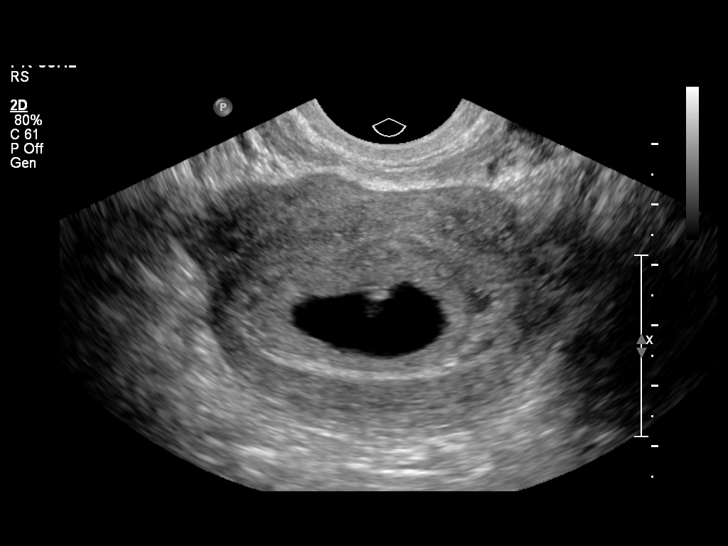
[im 16/46]
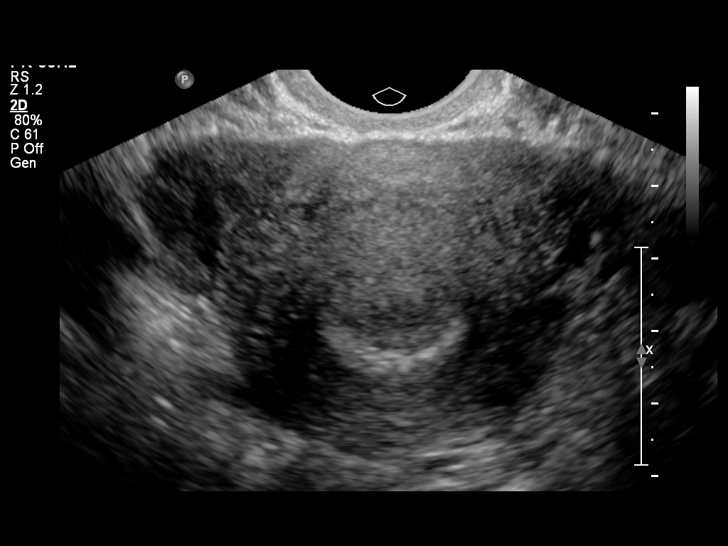
[im 19/46]
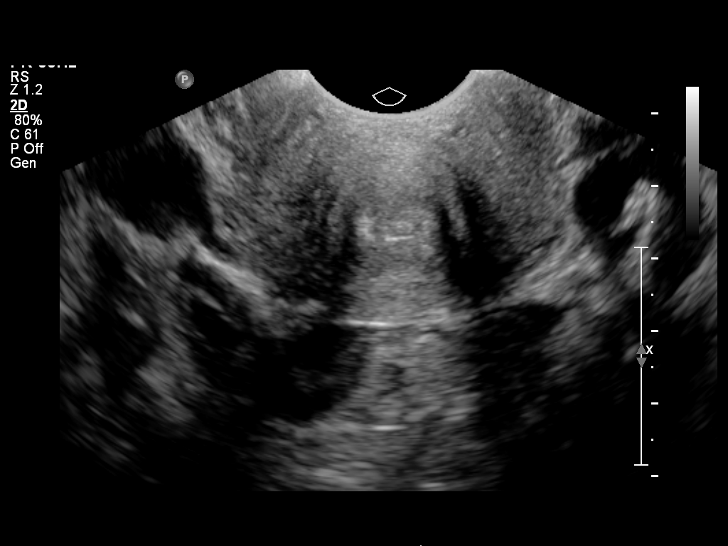
[im 22/46]
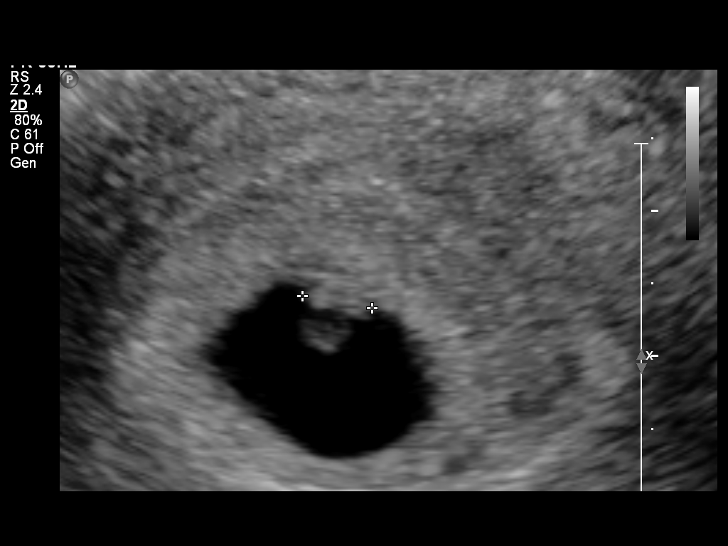
[im 26/46]
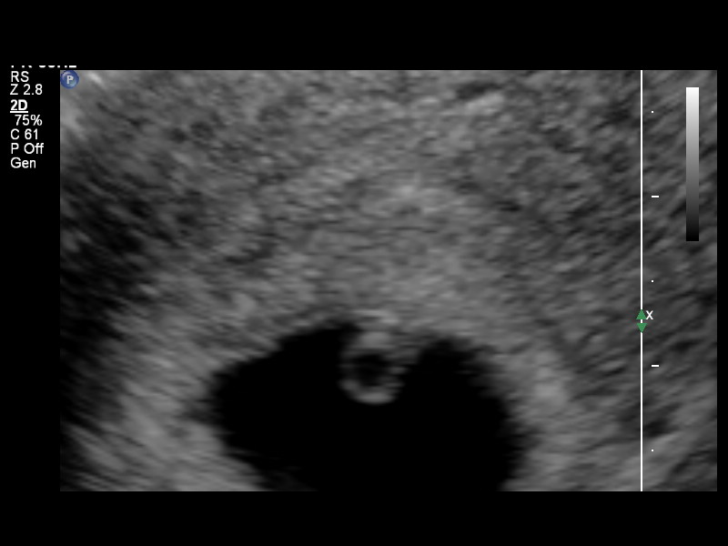
[im 29/46]
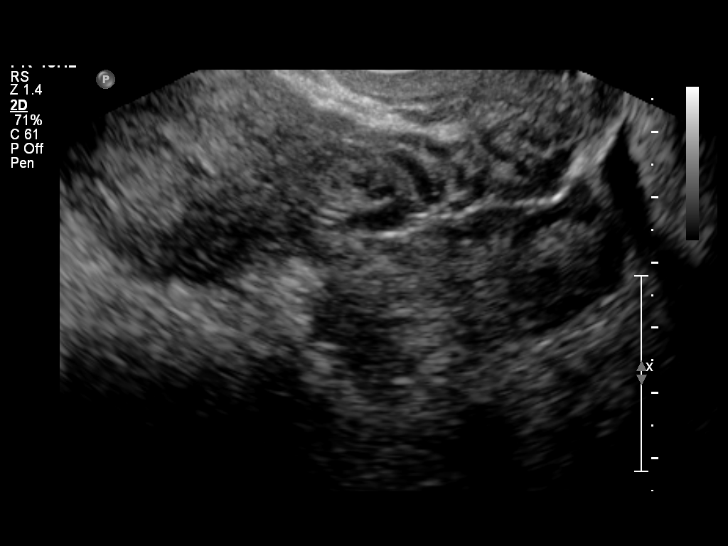
[im 32/46]
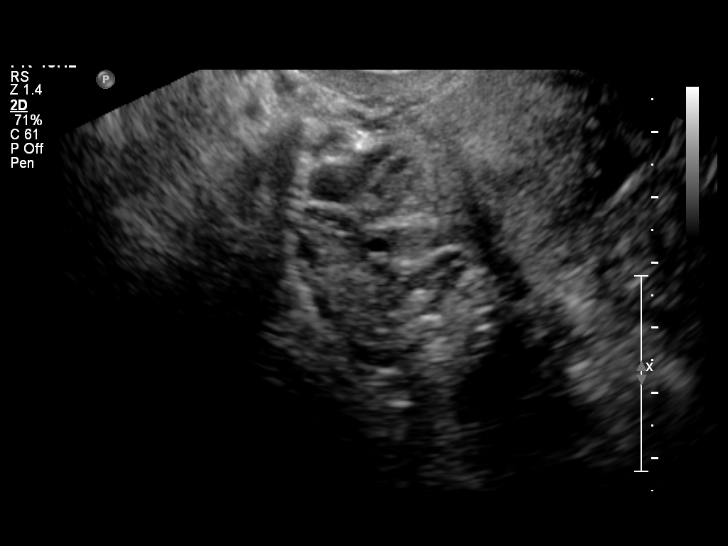
[im 36/46]
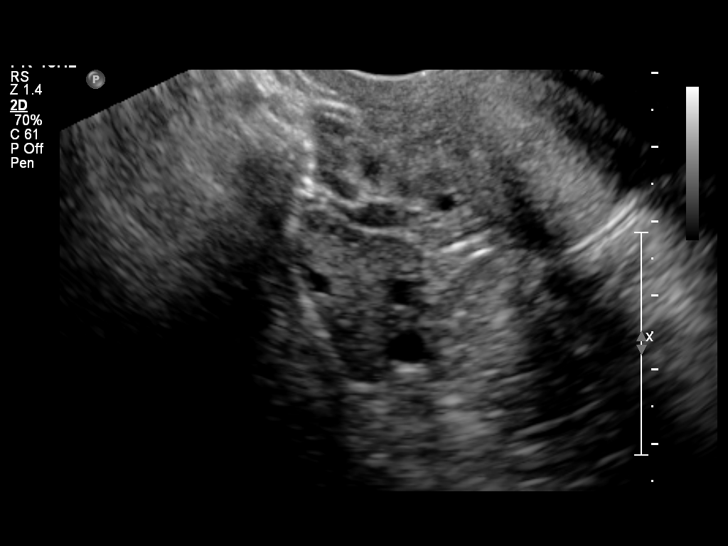
[im 39/46]
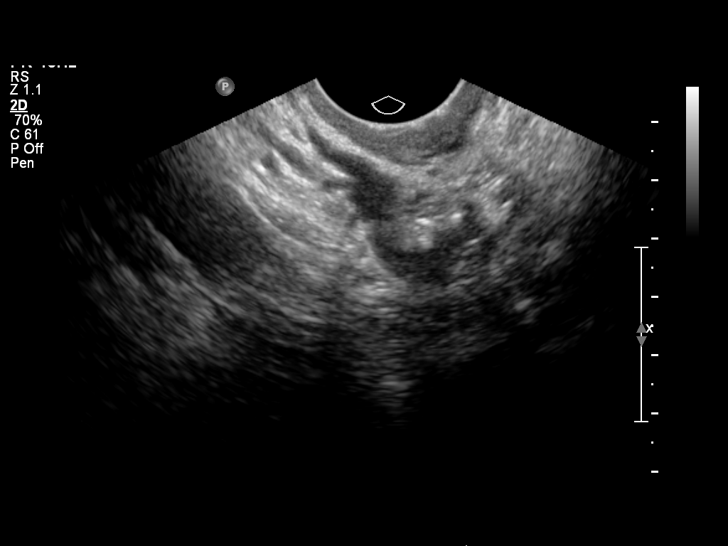
[im 42/46]
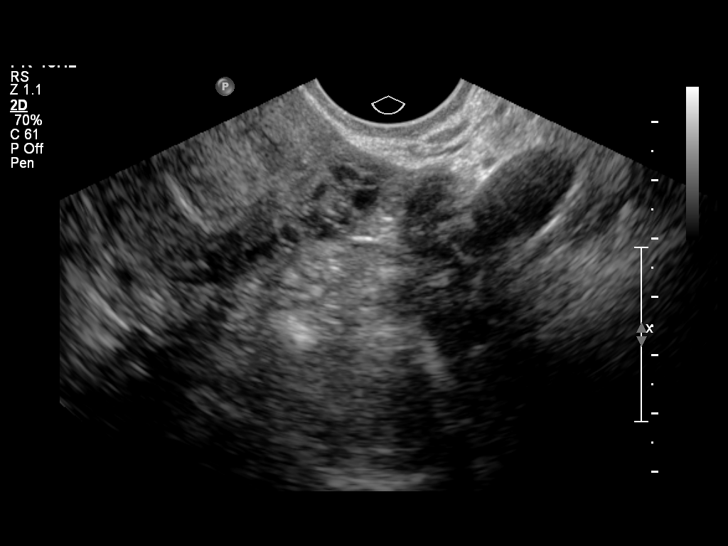
[im 46/46]
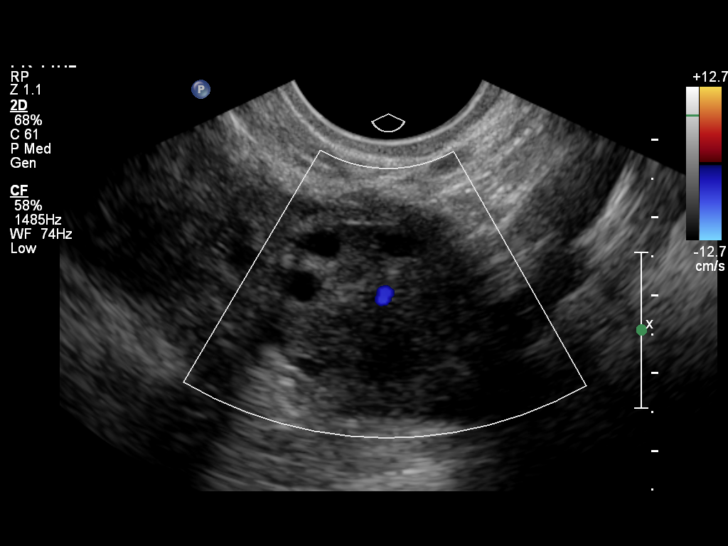

[14 of 28 positions shown; findings below may reference images not displayed]

FINDINGS: Intrauterine gestational sac: Single

Yolk sac:  Visualized

Embryo:  Visualized

Cardiac Activity: Visualized

Heart Rate: 120  bpm

CRL:  4.8  mm   6 w   2 d                  US EDC: 09/17/2015

Maternal uterus/adnexae:

Subchorionic hemorrhage: Small subchorionic hemorrhage

Right ovary: Normal

Left ovary: Normal

Other :None

Free fluid:  Small amount of free fluid noted.
IMPRESSION: 1. There is a single living intrauterine gestation. The estimated
gestational age is 6 weeks and 2 days.
2. Small subchorionic hemorrhage.

## 2017-01-22 ENCOUNTER — Emergency Department (HOSPITAL_COMMUNITY)
Admission: EM | Admit: 2017-01-22 | Discharge: 2017-01-22 | Disposition: A | Discharge status: Home / Self Care | Payer: Managed Care, Other (non HMO) | Attending: Emergency Medicine | Admitting: Emergency Medicine

## 2017-01-22 ENCOUNTER — Encounter (HOSPITAL_COMMUNITY): Payer: Self-pay

## 2017-01-22 DIAGNOSIS — Z79899 Other long term (current) drug therapy: Secondary | ICD-10-CM | POA: Diagnosis not present

## 2017-01-22 DIAGNOSIS — J069 Acute upper respiratory infection, unspecified: Secondary | ICD-10-CM | POA: Diagnosis not present

## 2017-01-22 DIAGNOSIS — R05 Cough: Secondary | ICD-10-CM | POA: Diagnosis present

## 2017-01-22 MED ORDER — CYCLOBENZAPRINE HCL 5 MG PO TABS: 5.0000 mg | ORAL_TABLET | Freq: Every day | ORAL | 0 refills | Status: DC

## 2017-01-22 NOTE — ED Triage Notes (Signed)
Per pt, Pt is coming from work with complaints of body aches, cough, and hoarseness that started Saturday. Pt reports a dry cough and thought she had a "common cold." Reports taking OTC medication with relief.

## 2017-01-22 NOTE — ED Provider Notes (Signed)
[facility]-EMERGENCY DEPT Provider Note   CSN: [id number] Arrival date & time: 01/22/17  0941   By signing my name below, I, Freida Busman, attest that this documentation has been prepared under the direction and in the presence of Teressa Lower, NP. Electronically Signed: Freida Busman, Scribe. 01/22/2017. 11:13 AM.  History   Chief Complaint Chief Complaint  Patient presents with  . Generalized Body Aches  . Cough   The history is provided by the patient. No language interpreter was used.    HPI Comments:  Jenny Carr is a 31 y.o. female who presents to the Emergency Department complaining of a dry cough x 3 days. She reports associated congestion, hoarse voice, body aches, and mild sore throat. She also notes low grade fever with TMAX of 100.2 that has resolved. Pt reports recent sick contacts with similar symptoms. She has taken Dayquil and Nyquil with little improvement. Her LNMP was last month. She is not a smoker.   History reviewed. No pertinent past medical history.  Patient Active Problem List   Diagnosis Date Noted  . Indication for care in labor or delivery 09/18/2015  . [redacted] weeks gestation of pregnancy   . Encounter for fetal anatomic survey   . Obesity complicating pregnancy in second trimester   . Encounter for (NT) nuchal translucency scan   . [redacted] weeks gestation of pregnancy   . SROM (spontaneous rupture of membranes) 04/30/2011  . Obesity (BMI 30-39.9) 04/30/2011  . SVD (spontaneous vaginal delivery) 04/30/2011    History reviewed. No pertinent surgical history.  OB History    Gravida Para Term Preterm AB Living   SAB TAB Ectopic Multiple Live Births         0 2       Home Medications    Prior to Admission medications   Medication Sig Start Date End Date Taking? Authorizing Provider  cyclobenzaprine (FLEXERIL) 5 MG tablet Take 1 tablet (5 mg total) by mouth at bedtime. 01/22/17   Teressa Lower, NP  ibuprofen (ADVIL,MOTRIN) 600 MG  tablet Take 1 tablet (600 mg total) by mouth every 6 (six) hours. 09/20/15   Asiyah Mayra Reel, MD  Prenatal Vit-Fe Fumarate-FA (PRENATAL COMPLETE) 14-0.4 MG TABS Take 4 mg by mouth daily. 01/14/15   Oswaldo Conroy, PA-C    Family History No family history on file.  Social History Social History  Substance Use Topics  . Smoking status: Never Smoker  . Smokeless tobacco: Never Used  . Alcohol use No     Allergies   Patient has no known allergies.   Review of Systems Review of Systems  Constitutional: Positive for fever (resolved).  HENT: Positive for congestion, sore throat and voice change.   Respiratory: Positive for cough. Negative for shortness of breath.   Cardiovascular: Negative for chest pain.  Musculoskeletal: Positive for myalgias.    Physical Exam Updated Vital Signs BP 116/70 (BP Location: Left Arm)   Pulse 72   Temp 97.7 F (36.5 C) (Oral)   Resp 18   Ht  (1.549 m)   Wt 214 lb (97.1 kg)   SpO2 100%   BMI 40.43 kg/m   Physical Exam  Constitutional: She is oriented to person, place, and time. She appears well-developed and well-nourished. No distress.  HENT:  Head: Normocephalic and atraumatic.  Nasal congestion. Mild pharyngeal erythema  Eyes: Conjunctivae are normal.  Cardiovascular: Normal rate.   Pulmonary/Chest: Effort normal.  Abdominal: She  exhibits no distension.  Neurological: She is alert and oriented to person, place, and time.  Skin: Skin is warm and dry.  Psychiatric: She has a normal mood and affect.  Nursing note and vitals reviewed.    ED Treatments / Results  DIAGNOSTIC STUDIES:  Oxygen Saturation is 100% on RA, normal by my interpretation.    COORDINATION OF CARE:  11:05 AM Discussed treatment plan with pt at bedside and pt agreed to plan.  Labs (all labs ordered are listed, but only abnormal results are displayed) Labs Reviewed - No data to display  EKG  EKG Interpretation None       Radiology No results  found.  Procedures Procedures (including critical care time)  Medications Ordered in ED Medications - No data to display   Initial Impression / Assessment and Plan / ED Course  I have reviewed the triage vital signs and the nursing notes.  Pertinent labs & imaging results that were available during my care of the patient were reviewed by me and considered in my medical decision making (see chart for details).     Likely viral. Don't think an antibiotics are needed at his time  Final Clinical Impressions(s) / ED Diagnoses   Final diagnoses:  None    New Prescriptions New Prescriptions   CYCLOBENZAPRINE (FLEXERIL) 5 MG TABLET    Take 1 tablet (5 mg total) by mouth at bedtime.   I personally performed the services described in this documentation, which was scribed in my presence. The recorded information has been reviewed and is accurate.     Teressa Lower, NP 01/22/17 1233    Raeford Razor, MD 01/30/17 [id number]

## 2017-11-23 ENCOUNTER — Emergency Department (HOSPITAL_COMMUNITY)
Admission: EM | Admit: 2017-11-23 | Discharge: 2017-11-23 | Disposition: A | Discharge status: Home / Self Care | Payer: Managed Care, Other (non HMO) | Attending: Emergency Medicine | Admitting: Emergency Medicine

## 2017-11-23 ENCOUNTER — Other Ambulatory Visit: Payer: Self-pay

## 2017-11-23 ENCOUNTER — Encounter (HOSPITAL_COMMUNITY): Payer: Self-pay | Admitting: Emergency Medicine

## 2017-11-23 DIAGNOSIS — Z79899 Other long term (current) drug therapy: Secondary | ICD-10-CM | POA: Insufficient documentation

## 2017-11-23 DIAGNOSIS — M5410 Radiculopathy, site unspecified: Secondary | ICD-10-CM | POA: Insufficient documentation

## 2017-11-23 DIAGNOSIS — M792 Neuralgia and neuritis, unspecified: Secondary | ICD-10-CM

## 2017-11-23 MED ORDER — METHOCARBAMOL 500 MG PO TABS: 500.0000 mg | ORAL_TABLET | Freq: Two times a day (BID) | ORAL | 0 refills | Status: DC

## 2017-11-23 MED ORDER — NAPROXEN 500 MG PO TABS: 500.0000 mg | ORAL_TABLET | Freq: Two times a day (BID) | ORAL | 0 refills | Status: DC

## 2017-11-23 MED ORDER — HYDROCODONE-ACETAMINOPHEN 5-325 MG PO TABS: 1.0000 | ORAL_TABLET | ORAL | 0 refills | Status: DC | PRN

## 2017-11-23 MED ORDER — HYDROCODONE-ACETAMINOPHEN 5-325 MG PO TABS
1.0000 | ORAL_TABLET | Freq: Once | ORAL | Status: AC
Start: 2017-11-23 — End: 2017-11-23
  Administered 2017-11-23: 1 via ORAL
  Filled 2017-11-23: qty 1

## 2017-11-23 NOTE — ED Triage Notes (Signed)
Pt. Stated, My left arm has been become numb started last night. Lot of pain

## 2017-11-23 NOTE — Discharge Instructions (Signed)
Return to the ED with any concerns including weakness of arm, swelling of arm or hand, discoloration of arm or hand, or any other alarming symptoms  If you are breastfeeding, you should check with the pharmacist before taking these medications- if you are bottle feeding, it is OK to take the medications as prescribed

## 2017-11-23 NOTE — ED Provider Notes (Signed)
[facility] Provider Note   CSN: [id number] Arrival date & time: 11/23/17  [id number]     History   Chief Complaint Chief Complaint  Patient presents with  . Extremity Weakness  . Arm Pain    HPI Jenny Blancoiffany S Carr is a 32 y.o. female.  HPI  Patient presents with complaint of right shoulder and upper back pain with radiation down her right arm.  She states she had been lying on that arm and watching movie last night when she turned over in bed then had sharp pain in the right shoulder.  She thought the pain would get better overnight when she woke up this morning she has pain going down her right arm associated with some numbness.  She does not have any weakness of her arm hand or fingers.  She did not have any trauma.  There is no swelling of her arm.  She has not had any treatment prior to arrival  History reviewed. No pertinent past medical history.  Patient Active Problem List   Diagnosis Date Noted  . Indication for care in labor or delivery 09/18/2015  . [redacted] weeks gestation of pregnancy   . Encounter for fetal anatomic survey   . Obesity complicating pregnancy in second trimester   . Encounter for (NT) nuchal translucency scan   . [redacted] weeks gestation of pregnancy   . SROM (spontaneous rupture of membranes) 04/30/2011  . Obesity (BMI 30-39.9) 04/30/2011  . SVD (spontaneous vaginal delivery) 04/30/2011    History reviewed. No pertinent surgical history.  OB History    Gravida Para Term Preterm AB Living   2 2 2     2    SAB TAB Ectopic Multiple Live Births         0 2       Home Medications    Prior to Admission medications   Medication Sig Start Date End Date Taking? Authorizing Provider  cyclobenzaprine (FLEXERIL) 5 MG tablet Take 1 tablet (5 mg total) by mouth at bedtime. 01/22/17   Teressa LowerPickering, Vrinda, NP  HYDROcodone-acetaminophen (NORCO/VICODIN) 5-325 MG tablet Take 1 tablet by mouth every 4 (four) hours as needed. 11/23/17    Chivas Notz, Latanya MaudlinMartha L, MD  ibuprofen (ADVIL,MOTRIN) 600 MG tablet Take 1 tablet (600 mg total) by mouth every 6 (six) hours. 09/20/15   Mikell, Antionette PolesAsiyah Zahra, MD  methocarbamol (ROBAXIN) 500 MG tablet Take 1 tablet (500 mg total) by mouth 2 (two) times daily. 11/23/17   Chole Driver, Latanya MaudlinMartha L, MD  naproxen (NAPROSYN) 500 MG tablet Take 1 tablet (500 mg total) by mouth 2 (two) times daily. 11/23/17   Daviana Haymaker, Latanya MaudlinMartha L, MD  Prenatal Vit-Fe Fumarate-FA (PRENATAL COMPLETE) 14-0.4 MG TABS Take 4 mg by mouth daily. 01/14/15   Oswaldo Conroyreech, Victoria, PA-C    Family History No family history on file.  Social History Social History   Tobacco Use  . Smoking status: Never Smoker  . Smokeless tobacco: Never Used  Substance Use Topics  . Alcohol use: No  . Drug use: No     Allergies   Patient has no known allergies.   Review of Systems Review of Systems  ROS reviewed and all otherwise negative except for mentioned in HPI   Physical Exam Updated Vital Signs BP 128/81   Pulse 70   Temp 98.2 F (36.8 C) (Oral)   Resp 20   Ht 5\' 1"  (1.549 m)   LMP 10/16/2017   SpO2 100%   BMI 40.43 kg/m  Vitals reviewed Physical Exam  Physical Examination: General appearance - alert, well appearing, and in no distress Mental status - alert, oriented to person, place, and time Eyes - no conjunctival injection, no scleral icterus Neck - no midline tenderness to palpation Chest - clear to auscultation, no wheezes, rales or rhonchi, symmetric air entry Heart - normal rate, regular rhythm, normal S1, S2, no murmurs, rubs, clicks or gallops Back exam - no midline tenderness to palpation, ttp over right upper back, lower trapezius distribution Neurological - alert, oriented x 3, normal speech, equal grip strength bilaterally 5/5, decreased sensation to light touch in all 5 fingers.  Musculoskeletal -ttp diffusely over right upper extremity, otherwise  no joint tenderness, deformity or swelling Extremities - peripheral pulses normal,  no pedal edema, no clubbing or cyanosis Skin - normal coloration and turgor, no rashes  ED Treatments / Results  Labs (all labs ordered are listed, but only abnormal results are displayed) Labs Reviewed - No data to display  EKG  EKG Interpretation  Date/Time:  Saturday November 23 2017 09:44:10 EST Ventricular Rate:  66 PR Interval:  154 QRS Duration: 84 QT Interval:  408 QTC Calculation: 427 R Axis:   67 Text Interpretation:  Normal sinus rhythm Normal ECG No old tracing to compare Confirmed by Jerelyn Scott [id number]) on 11/23/2017 12:24:15 PM       Radiology No results found.  Procedures Procedures (including critical care time)  Medications Ordered in ED Medications  HYDROcodone-acetaminophen (NORCO/VICODIN) 5-325 MG per tablet 1 tablet (1 tablet Oral Given 11/23/17 1246)     Initial Impression / Assessment and Plan / ED Course  I have reviewed the triage vital signs and the nursing notes.  Pertinent labs & imaging results that were available during my care of the patient were reviewed by me and considered in my medical decision making (see chart for details).     Patient presents with complaint of right shoulder and upper back pain which radiates down her right upper extremity.  There is no swelling to suggest DVT or compartment syndrome.  Has not had any injury to suggest fracture requiring x-rays.  On exam her symptoms are more consistent with muscle spasm and radicular pain.  Patient will be treated with anti-inflammatories and muscle relaxers.  Advised follow-up with primary doctor initial medical treatment does not help her symptoms.  Discharged with strict return precautions.  Pt agreeable with plan.  Final Clinical Impressions(s) / ED Diagnoses   Final diagnoses:  Radicular pain in right arm    ED Discharge Orders        Ordered    HYDROcodone-acetaminophen (NORCO/VICODIN) 5-325 MG tablet  Every 4 hours PRN     11/23/17 1234    naproxen (NAPROSYN) 500 MG  tablet  2 times daily     11/23/17 1234    methocarbamol (ROBAXIN) 500 MG tablet  2 times daily     11/23/17 1234       Lowell Mcgurk, Latanya Maudlin, MD 11/23/17 1315

## 2020-10-15 NOTE — L&D Delivery Note (Addendum)
OB/GYN Faculty Practice Delivery Note  Jenny Carr is a 35 y.o. [id number] s/p VD at [redacted]w[redacted]d. She was admitted for IOL 2/2 gHTN.   AROM: 3h 4m with clear fluid GBS Status: Negative/-- (09/22 1053) Maximum Maternal Temperature: 98.6  Labor Progress: Initial SVE: 6.5/60/-3. She then progressed to complete with assistance of AROM/pit.   Delivery Date/Time: 07/16/2021 @1747  Delivery: Called to room and patient was complete and pushing. Head delivered midline OA. No nuchal cord present. Shoulder and body delivered in usual fashion. Infant with spontaneous cry, placed on mother's abdomen, dried and stimulated. Cord clamped x 2 after 1-minute delay, and cut by father. Cord blood drawn. Placenta delivered spontaneously with gentle cord traction after approximately 30 minutes and appeared intact on inspection. Fundus firm with massage and Pitocin. Labia, perineum, vagina, and cervix inspected for lacerations, found to have hemostatic perineal abrasion that did not require repair.    Baby Weight: pending  Placenta: 3 vessel, intact. Sent to L&D.  Complications: None Lacerations: Perineal abrasion; hemostatic EBL: 650 mL (majority of which resulted prior to placenta delivering)  Analgesia: Epidural, additional IV fentanyl 100mg  for placenta delivery   Infant:  APGAR (1 MIN): 8   APGAR (5 MINS): 9    Simone Autry-Lott, DO 07/16/2021, 6:41 PM PGY-3, [facility]    ATTESTATION  I was present, gloved, and supervising throughout the delivery and agree with above documentation in the resident's note. My edits were added.   , DO OB Fellow  Center for 09/15/2021 (Faculty Practice) 07/16/2021, 7:10 PM

## 2020-12-20 ENCOUNTER — Other Ambulatory Visit: Payer: Self-pay

## 2020-12-20 ENCOUNTER — Ambulatory Visit (INDEPENDENT_AMBULATORY_CARE_PROVIDER_SITE_OTHER): Payer: No Typology Code available for payment source | Admitting: *Deleted

## 2020-12-20 VITALS — BP 123/76 | HR 87 | Temp 97.9°F | Ht 61.0 in | Wt 251.0 lb

## 2020-12-20 DIAGNOSIS — Z789 Other specified health status: Secondary | ICD-10-CM

## 2020-12-20 DIAGNOSIS — Z3201 Encounter for pregnancy test, result positive: Secondary | ICD-10-CM

## 2020-12-20 DIAGNOSIS — Z3480 Encounter for supervision of other normal pregnancy, unspecified trimester: Secondary | ICD-10-CM | POA: Diagnosis not present

## 2020-12-20 DIAGNOSIS — O0990 Supervision of high risk pregnancy, unspecified, unspecified trimester: Secondary | ICD-10-CM | POA: Insufficient documentation

## 2020-12-20 LAB — POCT URINE PREGNANCY: Preg Test, Ur: POSITIVE — AB

## 2020-12-20 NOTE — Patient Instructions (Addendum)
Second Trimester of Pregnancy  The second trimester of pregnancy is from week 13 through week 27. This is also called months 4 through 6 of pregnancy. This is often the time when you feel your best. During the second trimester:  Morning sickness is less or has stopped.  You may have more energy.  You may feel hungry more often. At this time, your unborn baby (fetus) is growing very fast. At the end of the sixth month, the unborn baby may be up to 12 inches long and weigh about 1 pounds. You will likely start to feel the baby move between 16 and 20 weeks of pregnancy. Body changes during your second trimester Your body continues to go through many changes during this time. The changes vary and generally return to normal after the baby is born. Physical changes  You will gain more weight.  You may start to get stretch marks on your hips, belly (abdomen), and breasts.  Your breasts will grow and may hurt.  Dark spots or blotches may develop on your face.  A dark line from your belly button to the pubic area (linea nigra) may appear.  You may have changes in your hair. Health changes  You may have headaches.  You may have heartburn.  You may have trouble pooping (constipation).  You may have hemorrhoids or swollen, bulging veins (varicose veins).  Your gums may bleed.  You may pee (urinate) more often.  You may have back pain. Follow these instructions at home: Medicines  Take over-the-counter and prescription medicines only as told by your doctor. Some medicines are not safe during pregnancy.  Take a prenatal vitamin that contains at least 600 micrograms (mcg) of folic acid. Eating and drinking  Eat healthy meals that include: ? Fresh fruits and vegetables. ? Whole grains. ? Good sources of protein, such as meat, eggs, or tofu. ? Low-fat dairy products.  Avoid raw meat and unpasteurized juice, milk, and cheese.  You may need to take these actions to prevent or  treat trouble pooping: ? Drink enough fluids to keep your pee (urine) pale yellow. ? Eat foods that are high in fiber. These include beans, whole grains, and fresh fruits and vegetables. ? Limit foods that are high in fat and sugar. These include fried or sweet foods. Activity  Exercise only as told by your doctor. Most people can do their usual exercise during pregnancy. Try to exercise for 30 minutes at least 5 days a week.  Stop exercising if you have pain or cramps in your belly or lower back.  Do not exercise if it is too hot or too humid, or if you are in a place of great height (high altitude).  Avoid heavy lifting.  If you choose to, you may have sex unless your doctor tells you not to. Relieving pain and discomfort  Wear a good support bra if your breasts are sore.  Take warm water baths (sitz baths) to soothe pain or discomfort caused by hemorrhoids. Use hemorrhoid cream if your doctor approves.  Rest with your legs raised (elevated) if you have leg cramps or low back pain.  If you develop bulging veins in your legs: ? Wear support hose as told by your doctor. ? Raise your feet for 15 minutes, 3-4 times a day. ? Limit salt in your food. Safety  Wear your seat belt at all times when you are in a car.  Talk with your doctor if someone is hurting you or yelling   or yelling at you a lot. Lifestyle  Do not use hot tubs, steam rooms, or saunas.  Do not douche. Do not use tampons or scented sanitary pads.  Avoid cat litter boxes and soil used by cats. These carry germs that can harm your baby and can cause a loss of your baby by miscarriage or stillbirth.  Do not use herbal medicines, illegal drugs, or medicines that are not approved by your doctor. Do not drink alcohol.  Do not smoke or use any products that contain nicotine or tobacco. If you need help quitting, ask your doctor. General instructions  Keep all follow-up visits. This is important.  Ask your doctor  about local prenatal classes.  Ask your doctor about the right foods to eat or for help finding a counselor. Where to find more information  American Pregnancy Association: americanpregnancy.org  [employer]: www.acog.org  Office on [employer] Health: MightyReward.co.nz Contact a doctor if:  You have a headache that does not go away when you take medicine.  You have changes in how you see, or you see spots in front of your eyes.  You have mild cramps, pressure, or pain in your lower belly.  You continue to feel like you may vomit (nauseous), you vomit, or you have watery poop (diarrhea).  You have bad-smelling fluid coming from your vagina.  You have pain when you pee or your pee smells bad.  You have very bad swelling of your face, hands, ankles, feet, or legs.  You have a fever. Get help right away if:  You are leaking fluid from your vagina.  You have spotting or bleeding from your vagina.  You have very bad belly cramping or pain.  You have trouble breathing.  You have chest pain.  You faint.  You have not felt your baby move for the time period told by your doctor.  You have new or increased pain, swelling, or redness in an arm or leg. Summary  The second trimester of pregnancy is from week 13 through week 27 (months 4 through 6).  Eat healthy meals.  Exercise as told by your doctor. Most people can do their usual exercise during pregnancy.  Do not use herbal medicines, illegal drugs, or medicines that are not approved by your doctor. Do not drink alcohol.  Call your doctor if you get sick or if you notice anything unusual about your pregnancy. This information is not intended to replace advice given to you by your health care provider. Make sure you discuss any questions you have with your health care provider. Document Revised: 03/09/2020 Document Reviewed: 01/14/2020 Elsevier Patient Education  2021  Elsevier Inc.  Warning Signs During Pregnancy During pregnancy, your body goes through many changes. Some changes may be uncomfortable, but most do not represent a serious problem. However, it is important to learn when certain signs and symptoms may indicate a problem. Talk with your health care provider about your current health and any medical conditions you have. Make sure you know the symptoms to watch for and report. How does this affect me? Warning signs during pregnancy Let your health care provider know if you have any of the following warning signs:  Dizziness or feeling faint.  Nausea, vomiting, or diarrhea that lasts 24 hours or longer.  Spotting or bleeding from your vagina.  Abdominal cramping or pain in your pelvis or lower back.  Shortness of breath, difficulty breathing, or chest pain.  New or increased pain, swelling,  or redness in an arm or leg.  Your baby is moving less than usual or is not moving. You should also watch for signs of a serious medical condition called preeclampsia. This may include:  A severe, throbbing headache that does not go away.  Vision changes, such as blurred or double vision, light sensitivity, or seeing spots in front of your eyes.  Sudden or extreme swelling of your face, hands, legs, or feet. Pregnancy causes changes that may make it more likely for you to get an infection. Let your health care provider know if you have signs of infection, such as:  A fever.  A bad-smelling vaginal discharge.  Pain or burning when you urinate. How does this affect my baby? Throughout your pregnancy, always report any of the warning signs of a problem to your health care provider. This can help prevent complications that may affect your baby, including:  Increased risk for premature birth.  Infection that may be transmitted to your baby.  Increased risk for stillbirth. Follow these instructions at home:  Take over-the-counter and prescription  medicines only as told by your health care provider.  Keep all follow-up visits. This is important.   Where to find more information  Office on Women's Health: [employer]cl[employer].cz  [employer]: EmploymentAssurance.cz Contact a health care provider if:  You have any warning signs of problems during your pregnancy.  Any of the following apply to you during your pregnancy: ? You have strong emotions, such as sadness or anxiety, that interfere with work or personal relationships. ? You feel unsafe in your home. ? You are using tobacco products, alcohol, or drugs, and you need help to stop. Get help right away if:  You have signs or symptoms of labor before 37 weeks of pregnancy. These include: ? Contractions that are 5 minutes or less apart, or that increase in frequency, intensity, or length. ? Sudden, sharp abdominal pain or low back pain. ? Uncontrolled gush or trickle of fluid from your vagina. Summary  Always report any warning signs to your health care provider to prevent complications that may affect both you and your baby.  Talk with your health care provider about your current health and any medical conditions you have. Make sure you know the symptoms to watch for and report.  Keep all follow-up visits. This is important. This information is not intended to replace advice given to you by your health care provider. Make sure you discuss any questions you have with your health care provider. Document Revised: 03/09/2020 Document Reviewed: 02/05/2020 Elsevier Patient Education  2021 Elsevier Inc.  How to Take Your Blood Pressure Blood pressure measures how strongly your blood is pressing against the walls of your arteries. Arteries are blood vessels that carry blood from your heart throughout your body. You can take your blood pressure at home with a machine. You may need to check your blood pressure at home:  To check  if you have high blood pressure (hypertension).  To check your blood pressure over time.  To make sure your blood pressure medicine is working. Supplies needed:  Blood pressure machine, or monitor.  Dining room chair to sit in.  Table or desk.  Small notebook.  Pencil or pen. How to prepare Avoid these things for 30 minutes before checking your blood pressure:  Having drinks with caffeine in them, such as coffee or tea.  Drinking alcohol.  Eating.  Smoking.  Exercising. Do these things five minutes before  checking your blood pressure:  Go to the bathroom and pee (urinate).  Sit in a dining chair. Do not sit in a soft couch or an armchair.  Be quiet. Do not talk. How to take your blood pressure Follow the instructions that came with your machine. If you have a digital blood pressure monitor, these may be the instructions: 1. Sit up straight. 2. Place your feet on the floor. Do not cross your ankles or legs. 3. Rest your left arm at the level of your heart. You may rest it on a table, desk, or chair. 4. Pull up your shirt sleeve. 5. Wrap the blood pressure cuff around the upper part of your left arm. The cuff should be 1 inch (2.5 cm) above your elbow. It is best to wrap the cuff around bare skin. 6. Fit the cuff snugly around your arm. You should be able to place only one finger between the cuff and your arm. 7. Place the cord so that it rests in the bend of your elbow. 8. Press the power button. 9. Sit quietly while the cuff fills with air and loses air. 10. Write down the numbers on the screen. 11. Wait 2-3 minutes and then repeat steps 1-10.   What do the numbers mean? Two numbers make up your blood pressure. The first number is called systolic pressure. The second is called diastolic pressure. An example of a blood pressure reading is "120 over 80" (or 120/80). If you are an adult and do not have a medical condition, use this guide to find out if your blood  pressure is normal: Normal  First number: below 120.  Second number: below 80. Elevated  First number: 120-129.  Second number: below 80. Hypertension stage 1  First number: 130-139.  Second number: 80-89. Hypertension stage 2  First number: 140 or above.  Second number: 90 or above. Your blood pressure is above normal even if only the top or bottom number is above normal. Follow these instructions at home:  Check your blood pressure as often as your doctor tells you to.  Check your blood pressure at the same time every day.  Take your monitor to your next doctor's appointment. Your doctor will: ? Make sure you are using it correctly. ? Make sure it is working right.  Make sure you understand what your blood pressure numbers should be.  Tell your doctor if your medicine is causing side effects.  Keep all follow-up visits as told by your doctor. This is important. General tips:  You will need a blood pressure machine, or monitor. Your doctor can suggest a monitor. You can buy one at a drugstore or online. When choosing one: ? Choose one with an arm cuff. ? Choose one that wraps around your upper arm. Only one finger should fit between your arm and the cuff. ? Do not choose one that measures your blood pressure from your wrist or finger. Where to find more information American Heart Association: www.heart.org Contact a doctor if:  Your blood pressure keeps being high. Get help right away if:  Your first blood pressure number is higher than 180.  Your second blood pressure number is higher than 120. Summary  Check your blood pressure at the same time every day.  Avoid caffeine, alcohol, smoking, and exercise for 30 minutes before checking your blood pressure.  Make sure you understand what your blood pressure numbers should be. This information is not intended to replace advice given to you  by your health care provider. Make sure you discuss any questions you  have with your health care provider. Document Revised: 09/25/2019 Document Reviewed: 09/25/2019 Elsevier Patient Education  2021 Elsevier Inc.  Genetic Testing During Pregnancy Why is genetic testing done? Genetic testing during pregnancy is also called prenatal genetic testing. This type of testing can determine if your baby is at risk of being born with a disorder caused by abnormal genes or chromosomes (genetic disorder). Chromosomes contain genes that control how your baby will develop in your womb. There are many different genetic disorders. Examples of genetic disorders that may be found through genetic testing include Down syndrome and cystic fibrosis. Gene changes (mutations) can be passed down through families. Genetic testing is offered to women during pregnancy. You can choose whether to have genetic testing. Having genetic testing allows you to:  Discuss your test results and options with your health care provider.  Prepare for a baby that may be born with a genetic disorder. Learning about the disorder ahead of time helps you be better prepared to manage it. Your health care providers can also be prepared in case your baby requires special care before or after birth.  Consider whether you want to continue with the pregnancy. In some cases, genetic testing may be done to learn about the traits a child will inherit. Types of genetic tests There are two basic types of genetic testing. Screening tests indicate whether your developing baby (fetus) is at higher risk for a genetic disorder. Diagnostic tests check actual fetal cells to diagnose a genetic disorder. Screening tests Screening tests will not harm your baby. They are recommended for all pregnant women. Types of screening tests include:  Carrier screening. This test involves checking genes from both parents by testing their blood or saliva. The test checks to find out if the parents carry a genetic mutation that may be passed to  a baby. In most cases, both parents must carry the mutation for a baby to be at risk.  First trimester screening. This test combines a blood test with sound wave imaging of your baby (fetal ultrasound). This screening test checks for a risk of Down syndrome or other defects caused by having extra chromosomes. The ultrasound also checks for defects of the heart, abdomen, or skeleton.  Second trimester screening also combines a blood test with a fetal ultrasound exam. This test checks for a risk of Down syndrome or other defects caused by having extra chromosomes. The ultrasound allows your health care provider to look for genetic defects of the face, brain, spine, heart, or limbs. Some women may choose to only have an ultrasound exam without a blood test.  Combined or sequential screening. This type of testing combines the results of first and second trimester screening. This type of testing may be more accurate than first or second trimester screening alone.  Cell-free DNA testing. This is a blood test that detects cells released by the placenta that get into the mother's blood. It can be used to check for a risk of Down syndrome, other extra chromosome syndromes, and disorders caused by abnormal numbers of sex chromosomes. This test can be done any time after 10 weeks of pregnancy.      Diagnostic tests Diagnostic tests carry slight risks of problems, including bleeding, infection, and loss of the pregnancy. These tests are done only if your baby is at risk for a genetic disorder. Your health care provider will discuss the risks and benefits of having  diagnostic tests before performing these types of tests. Examples of diagnostic tests include:  Chorionic villus sampling (CVS). This involves a procedure to remove and test a sample of cells taken from the placenta. The procedure may be done between 10 and 12 weeks of pregnancy.  Amniocentesis. This involves a procedure to remove and test a sample of  fluid (amniotic fluid) and cells from the sac that surrounds the developing baby. The procedure may be done any time during the pregnancy, but it is usually done between 15 and 20 weeks of pregnancy. What do the results mean? For a screening test:  If the results are negative, it often means that your child is not at higher risk. There is still a slight chance your child could have a genetic disorder.  If the results are positive, it does not mean your child will have a genetic disorder. It may mean that your child has a higher-than-normal risk for a genetic disorder. In that case, you should talk with your health care provider about whether you should have diagnostic genetic tests. For a diagnostic test:  If the result is negative, it is unlikely that your child will have a genetic disorder.  If the test is positive for a genetic disorder, it is likely that your child will have the disorder. The test may not tell how severe the disorder will be. Talk with your health care provider about your options. Talk with your health care provider about what your results mean. Questions to ask your health care provider Before talking to your health care provider about genetic testing, find out if there is a history of genetic disorders in your family. It may also help to know your family's ethnic origins. Then ask your health care provider the following questions:  Is my baby at risk for a genetic disorder?  What are the benefits of having genetic screening?  What tests are best for me and my baby?  What are the risks of each test?  If I get a positive result on a screening test, what is the next step?  Should I meet with a genetic counselor?  Should my partner or other members of my family be tested?  How much do the tests cost? Will my insurance cover the testing? Summary  Genetic testing is done during pregnancy to find out whether your child is at risk for a genetic disorder.  Genetic  testing is offered to women during pregnancy. You can choose whether to have genetic testing.  There are two basic types of genetic testing. Screening tests indicate whether your developing baby (fetus) is at higher risk for a genetic disorder. Diagnostic tests check actual fetal cells to diagnose a genetic disorder.  If a diagnostic genetic test is positive, talk with your health care provider about your options.  Safe Medications in Pregnancy   Acne: Benzoyl Peroxide Salicylic Acid  Backache/Headache: Tylenol: 2 regular strength every 4 hours OR              2 Extra strength every 6 hours  Colds/Coughs/Allergies: Benadryl (alcohol free) 25 mg every 6 hours as needed Breath right strips Claritin Cepacol throat lozenges Chloraseptic throat spray Cold-Eeze- up to three times per day Cough drops, alcohol free Flonase (by prescription only) Guaifenesin Mucinex Robitussin DM (plain only, alcohol free) Saline nasal spray/drops Sudafed (pseudoephedrine) & Actifed ** use only after [redacted] weeks gestation and if you do not have high blood pressure Tylenol Vicks Vaporub Zinc lozenges Zyrtec  Constipation: Colace Ducolax suppositories Fleet enema Glycerin suppositories Metamucil Milk of magnesia Miralax Senokot Smooth move tea  Diarrhea: Kaopectate Imodium A-D  *NO pepto Bismol  Hemorrhoids: Anusol Anusol HC Preparation H Tucks  Indigestion: Tums Maalox Mylanta Zantac  Pepcid  Insomnia: Benadryl (alcohol free)  every 6 hours as needed Tylenol PM Unisom, no Gelcaps  Leg Cramps: Tums MagGel  Nausea/Vomiting:  Bonine Dramamine Emetrol Ginger extract Sea bands Meclizine  Nausea medication to take during pregnancy:  Unisom (doxylamine succinate 25 mg tablets) Take one tablet daily at bedtime. If symptoms are not adequately controlled, the dose can be increased to a maximum recommended dose of two tablets daily (1/2 tablet in the morning, 1/2 tablet  mid-afternoon and one at bedtime). Vitamin B6  tablets. Take one tablet twice a day (up to 200 mg per day).  Skin Rashes: Aveeno products Benadryl cream or  every 6 hours as needed Calamine Lotion 1% cortisone cream  Yeast infection: Gyne-lotrimin 7 Monistat 7   **If taking multiple medications, please check labels to avoid duplicating the same active ingredients **take medication as directed on the label ** Do not exceed 4000 mg of tylenol in 24 hours **Do not take medications that contain aspirin or ibuprofen      This information is not intended to replace advice given to you by your health care provider. Make sure you discuss any questions you have with your health care provider. Document Revised: 04/22/2020 Document Reviewed: 04/22/2020 Elsevier Patient Education  2021 [employer].

## 2020-12-20 NOTE — Progress Notes (Signed)
   Location: [facility] Patient: clinic Provider: clinic  PRENATAL INTAKE SUMMARY  Ms. Lobb presents today New OB Nurse Interview.  OB History    Gravida  3   Para  2   Term  2   Preterm      AB      Living  2     SAB      IAB      Ectopic      Multiple  0   Live Births  2          I have reviewed the patient's medical, obstetrical, social, and family histories, medications, and available lab results.  SUBJECTIVE She has no unusual complaints  OBJECTIVE Initial Nurse interview for history/labs (New OB)  EDD: unknown- unsure of LMP GA: unknown [id number] FHT: non assessed  GENERAL APPEARANCE: alert, well appearing, in no apparent distress, oriented to person, place and time, overweight   ASSESSMENT Positive UPT Normal pregnancy  PLAN Prenatal care:  [facility] Pnl/HIV/Hep C OB Urine Culture A1C ROI to Plan ParentHood for PAP records Panorama/Horizon to be completed at next visit BP monitor given, patient to purchase weight scale Ultrasound <14 weeks to confirm dating/viability Start PNV Patient to sign up for Bab[facility] NOB appointment 323/22 with Donia Ast, NP   Follow Up Instructions:   I discussed the assessment and treatment plan with the patient. The patient was provided an opportunity to ask questions and all were answered. The patient agreed with the plan and demonstrated an understanding of the instructions.   The patient was advised to call back or seek an in-person evaluation if the symptoms worsen or if the condition fails to improve as anticipated.  I provided 40 minutes of  face-to-face time during this encounter.  Clovis Pu, RN

## 2020-12-21 LAB — CBC/D/PLT+RPR+RH+ABO+RUB AB...
Antibody Screen: NEGATIVE
Basophils Absolute: 0 10*3/uL (ref 0.0–0.2)
Basos: 0 %
EOS (ABSOLUTE): 0.1 10*3/uL (ref 0.0–0.4)
Eos: 3 %
HCV Ab: <0.1 s/co ratio (ref 0.0–0.9)
HIV Screen 4th Generation wRfx: NONREACTIVE
Hematocrit: 36.3 % (ref 34.0–46.6)
Hemoglobin: 11.6 g/dL (ref 11.1–15.9)
Hepatitis B Surface Ag: NEGATIVE
Immature Grans (Abs): 0 10*3/uL (ref 0.0–0.1)
Immature Granulocytes: 1 %
Lymphocytes Absolute: 1.5 10*3/uL (ref 0.7–3.1)
Lymphs: 28 %
MCH: 23.6 pg — ABNORMAL LOW (ref 26.6–33.0)
MCHC: 32 g/dL (ref 31.5–35.7)
MCV: 74 fL — ABNORMAL LOW (ref 79–97)
Monocytes Absolute: 0.4 10*3/uL (ref 0.1–0.9)
Monocytes: 8 %
Neutrophils Absolute: 3.1 10*3/uL (ref 1.4–7.0)
Neutrophils: 60 %
Platelets: 308 10*3/uL (ref 150–450)
RBC: 4.91 x10E6/uL (ref 3.77–5.28)
RDW: 14.8 % (ref 11.7–15.4)
RPR Ser Ql: NONREACTIVE
Rh Factor: POSITIVE
Rubella Antibodies, IGG: 7.45 index (ref >0.99)
WBC: 5.2 10*3/uL (ref 3.4–10.8)

## 2020-12-21 LAB — HCV INTERPRETATION

## 2020-12-21 LAB — HEMOGLOBIN A1C
Est. average glucose Bld gHb Est-mCnc: 111 mg/dL
Hgb A1c MFr Bld: 5.5 % (ref 4.8–5.6)

## 2020-12-23 ENCOUNTER — Other Ambulatory Visit: Payer: Self-pay | Admitting: Certified Nurse Midwife

## 2020-12-23 DIAGNOSIS — O0933 Supervision of pregnancy with insufficient antenatal care, third trimester: Secondary | ICD-10-CM | POA: Insufficient documentation

## 2020-12-23 DIAGNOSIS — O2341 Unspecified infection of urinary tract in pregnancy, first trimester: Secondary | ICD-10-CM

## 2020-12-23 DIAGNOSIS — O2340 Unspecified infection of urinary tract in pregnancy, unspecified trimester: Secondary | ICD-10-CM | POA: Insufficient documentation

## 2020-12-23 HISTORY — DX: Unspecified infection of urinary tract in pregnancy, unspecified trimester: O23.40

## 2020-12-23 MED ORDER — CEFADROXIL 500 MG PO CAPS: 500.0000 mg | ORAL_CAPSULE | Freq: Two times a day (BID) | ORAL | 0 refills | Status: DC

## 2020-12-23 NOTE — Progress Notes (Signed)
+  UTI, Rx Duricef.

## 2020-12-24 LAB — URINE CULTURE, OB REFLEX

## 2020-12-24 LAB — CULTURE, OB URINE

## 2020-12-26 ENCOUNTER — Other Ambulatory Visit: Payer: Self-pay | Admitting: *Deleted

## 2020-12-26 DIAGNOSIS — Z3480 Encounter for supervision of other normal pregnancy, unspecified trimester: Secondary | ICD-10-CM

## 2020-12-26 MED ORDER — PRENATAL 27-1 MG PO TABS: 1.0000 | ORAL_TABLET | Freq: Every day | ORAL | 12 refills | Status: AC

## 2020-12-28 ENCOUNTER — Telehealth: Payer: Self-pay | Admitting: Obstetrics and Gynecology

## 2020-12-28 ENCOUNTER — Other Ambulatory Visit: Payer: Self-pay

## 2020-12-28 ENCOUNTER — Other Ambulatory Visit: Payer: Self-pay | Admitting: Women's Health

## 2020-12-28 ENCOUNTER — Ambulatory Visit
Admission: RE | Admit: 2020-12-28 | Discharge: 2020-12-28 | Disposition: A | Discharge status: Home / Self Care | Payer: No Typology Code available for payment source | Source: Ambulatory Visit | Attending: Women's Health | Admitting: Women's Health

## 2020-12-28 DIAGNOSIS — Z789 Other specified health status: Secondary | ICD-10-CM

## 2020-12-28 DIAGNOSIS — Z3480 Encounter for supervision of other normal pregnancy, unspecified trimester: Secondary | ICD-10-CM | POA: Insufficient documentation

## 2020-12-28 NOTE — Telephone Encounter (Signed)
[place] results back. Attempted to call patient. No answer. Will attempt to call patient later.  Venia Carbon I, NP 12/28/2020 2:28 PM

## 2020-12-28 NOTE — Telephone Encounter (Signed)
I called Jenny Carr today at 2:52 PM and confirmed patient's identity using two patient identifiers. [place] results from earlier today were reviewed. Patient is scheduled for new OB visit at [facility] on 01/04/21. First trimester warning signs reviewed. Patient voiced understanding and had no further questions.   US OB Comp Less 14 Wks  Result Date: 12/28/2020 CLINICAL DATA:  Dating, viability EXAM: OBSTETRIC <14 WK ULTRASOUND TECHNIQUE: Transabdominal ultrasound was performed for evaluation of the gestation as well as the maternal uterus and adnexal regions. COMPARISON:  None. FINDINGS: Intrauterine gestational sac: Single Yolk sac:  Visualized. Embryo:  Visualized. Cardiac Activity: Visualized. Heart Rate: 176 bpm CRL: 23.9 mm   9 w 1 d                  [place] EDC: 08/01/2021 Subchorionic hemorrhage:  None visualized. Maternal uterus/adnexae: Unremarkable.  No free fluid. IMPRESSION: Single live intrauterine pregnancy as described. Electronically Signed   By: Guadlupe Spanish M.D.   On: 12/28/2020 11:41    Cache Decoursey, Harolyn Rutherford, NP 12/28/2020 2:52 PM

## 2021-01-02 ENCOUNTER — Other Ambulatory Visit: Payer: Self-pay | Admitting: *Deleted

## 2021-01-02 ENCOUNTER — Encounter: Payer: Self-pay | Admitting: *Deleted

## 2021-01-02 DIAGNOSIS — Z3480 Encounter for supervision of other normal pregnancy, unspecified trimester: Secondary | ICD-10-CM

## 2021-01-04 ENCOUNTER — Ambulatory Visit (INDEPENDENT_AMBULATORY_CARE_PROVIDER_SITE_OTHER): Payer: No Typology Code available for payment source | Admitting: Women's Health

## 2021-01-04 ENCOUNTER — Other Ambulatory Visit (HOSPITAL_COMMUNITY)
Admission: RE | Admit: 2021-01-04 | Discharge: 2021-01-04 | Disposition: A | Discharge status: Home / Self Care | Payer: No Typology Code available for payment source | Source: Ambulatory Visit | Attending: Women's Health | Admitting: Women's Health

## 2021-01-04 ENCOUNTER — Other Ambulatory Visit: Payer: Self-pay

## 2021-01-04 VITALS — BP 133/83 | HR 83 | Temp 98.4°F | Wt 252.6 lb

## 2021-01-04 DIAGNOSIS — O09899 Supervision of other high risk pregnancies, unspecified trimester: Secondary | ICD-10-CM | POA: Diagnosis not present

## 2021-01-04 DIAGNOSIS — Z3A15 15 weeks gestation of pregnancy: Secondary | ICD-10-CM

## 2021-01-04 DIAGNOSIS — O2341 Unspecified infection of urinary tract in pregnancy, first trimester: Secondary | ICD-10-CM

## 2021-01-04 DIAGNOSIS — Z3480 Encounter for supervision of other normal pregnancy, unspecified trimester: Secondary | ICD-10-CM | POA: Insufficient documentation

## 2021-01-04 NOTE — Patient Instructions (Signed)
Maternity Assessment Unit (MAU)  The Maternity Assessment Unit (MAU) is located at the [facility] at [facility]. The address is: [address] [place], [place], [place] [address]. Please see map below for additional directions.    The Maternity Assessment Unit is designed to help you during your pregnancy, and for up to 6 weeks after delivery, with any pregnancy- or postpartum-related emergencies, if you think you are in labor, or if your water has broken. For example, if you experience nausea and vomiting, vaginal bleeding, severe abdominal or pelvic pain, elevated blood pressure or other problems related to your pregnancy or postpartum time, please come to the Maternity Assessment Unit for assistance.                        Safe Medications in Pregnancy    Acne: Benzoyl Peroxide Salicylic Acid  Backache/Headache: Tylenol: 2 regular strength every 4 hours OR              2 Extra strength every 6 hours  Colds/Coughs/Allergies: Benadryl (alcohol free) 25 mg every 6 hours as needed Breath right strips Claritin Cepacol throat lozenges Chloraseptic throat spray Cold-Eeze- up to three times per day Cough drops, alcohol free Flonase (by prescription only) Guaifenesin Mucinex Robitussin DM (plain only, alcohol free) Saline nasal spray/drops Sudafed (pseudoephedrine) & Actifed ** use only after [redacted] weeks gestation and if you do not have high blood pressure Tylenol Vicks Vaporub Zinc lozenges Zyrtec   Constipation: Colace Ducolax suppositories Fleet enema Glycerin suppositories Metamucil Milk of magnesia Miralax Senokot Smooth move tea  Diarrhea: Kaopectate Imodium A-D  *NO pepto Bismol  Hemorrhoids: Anusol Anusol HC Preparation H Tucks  Indigestion: Tums Maalox Mylanta Zantac  Pepcid  Insomnia: Benadryl (alcohol free) 25mg  every 6 hours as needed Tylenol PM Unisom, no Gelcaps  Leg  Cramps: Tums MagGel  Nausea/Vomiting:  Bonine Dramamine Emetrol Ginger extract Sea bands Meclizine  Nausea medication to take during pregnancy:  Unisom (doxylamine succinate 25 mg tablets) Take one tablet daily at bedtime. If symptoms are not adequately controlled, the dose can be increased to a maximum recommended dose of two tablets daily (1/2 tablet in the morning, 1/2 tablet mid-afternoon and one at bedtime). Vitamin B6 100mg  tablets. Take one tablet twice a day (up to 200 mg per day).  Skin Rashes: Aveeno products Benadryl cream or 25mg  every 6 hours as needed Calamine Lotion 1% cortisone cream  Yeast infection: Gyne-lotrimin 7 Monistat 7   **If taking multiple medications, please check labels to avoid duplicating the same active ingredients **take medication as directed on the label ** Do not exceed 4000 mg of tylenol in 24 hours **Do not take medications that contain aspirin or ibuprofen           Obstetrics: Normal and Problem Pregnancies (7th ed., pp. 102-121). [place], [place]: Elsevier."> Textbook of Family Medicine (9th ed., pp. [phone]). [place], [place]: Elsevier [employer].">  First Trimester of Pregnancy  The first trimester of pregnancy starts on the first day of your last menstrual period until the end of week 12. This is months 1 through 3 of pregnancy. A week after a sperm fertilizes an egg, the egg will implant into the wall of the uterus and begin to develop into a baby. By the end of 12 weeks, all the baby's organs will be formed and the baby will be 2-3 inches in size. Body changes during your first trimester Your body goes through many changes  during pregnancy. The changes vary and generally return to normal after your baby is born. Physical changes  You may gain or lose weight.  Your breasts may begin to grow larger and become tender. The tissue that surrounds your nipples (areola) may become darker.  Dark spots or blotches (chloasma or mask  of pregnancy) may develop on your face.  You may have changes in your hair. These can include thickening or thinning of your hair or changes in texture. Health changes  You may feel nauseous, and you may vomit.  You may have heartburn.  You may develop headaches.  You may develop constipation.  Your gums may bleed and may be sensitive to brushing and flossing. Other changes  You may tire easily.  You may urinate more often.  Your menstrual periods will stop.  You may have a loss of appetite.  You may develop cravings for certain kinds of food.  You may have changes in your emotions from day to day.  You may have more vivid and strange dreams. Follow these instructions at home: Medicines  Follow your health care provider's instructions regarding medicine use. Specific medicines may be either safe or unsafe to take during pregnancy. Do not take any medicines unless told to by your health care provider.  Take a prenatal vitamin that contains at least 600 micrograms (mcg) of folic acid. Eating and drinking  Eat a healthy diet that includes fresh fruits and vegetables, whole grains, good sources of protein such as meat, eggs, or tofu, and low-fat dairy products.  Avoid raw meat and unpasteurized juice, milk, and cheese. These carry germs that can harm you and your baby.  If you feel nauseous or you vomit: ? Eat 4 or 5 small meals a day instead of 3 large meals. ? Try eating a few soda crackers. ? Drink liquids between meals instead of during meals.  You may need to take these actions to prevent or treat constipation: ? Drink enough fluid to keep your urine pale yellow. ? Eat foods that are high in fiber, such as beans, whole grains, and fresh fruits and vegetables. ? Limit foods that are high in fat and processed sugars, such as fried or sweet foods. Activity  Exercise only as directed by your health care provider. Most people can continue their usual exercise routine  during pregnancy. Try to exercise for 30 minutes at least 5 days a week.  Stop exercising if you develop pain or cramping in the lower abdomen or lower back.  Avoid exercising if it is very hot or humid or if you are at high altitude.  Avoid heavy lifting.  If you choose to, you may have sex unless your health care provider tells you not to. Relieving pain and discomfort  Wear a good support bra to relieve breast tenderness.  Rest with your legs elevated if you have leg cramps or low back pain.  If you develop bulging veins (varicose veins) in your legs: ? Wear support hose as told by your health care provider. ? Elevate your feet for 15 minutes, 3-4 times a day. ? Limit salt in your diet. Safety  Wear your seat belt at all times when driving or riding in a car.  Talk with your health care provider if someone is verbally or physically abusive to you.  Talk with your health care provider if you are feeling sad or have thoughts of hurting yourself. Lifestyle  Do not use hot tubs, steam rooms, or saunas.  Do not douche. Do not use tampons or scented sanitary pads.  Do not use herbal remedies, alcohol, illegal drugs, or medicines that are not approved by your health care provider. Chemicals in these products can harm your baby.  Do not use any products that contain nicotine or tobacco, such as cigarettes, e-cigarettes, and chewing tobacco. If you need help quitting, ask your health care provider.  Avoid cat litter boxes and soil used by cats. These carry germs that can cause birth defects in the baby and possibly loss of the unborn baby (fetus) by miscarriage or stillbirth. General instructions  During routine prenatal visits in the first trimester, your health care provider will do a physical exam, perform necessary tests, and ask you how things are going. Keep all follow-up visits. This is important.  Ask for help if you have counseling or nutritional needs during pregnancy.  Your health care provider can offer advice or refer you to specialists for help with various needs.  Schedule a dentist appointment. At home, brush your teeth with a soft toothbrush. Floss gently.  Write down your questions. Take them to your prenatal visits. Where to find more information  American Pregnancy Association: americanpregnancy.org  [employer] of [employer]stetricians and Gynecologists: https://www.todd-brady.net/  Office on [employer] Health: MightyReward.co.nz Contact a health care provider if you have:  Dizziness.  A fever.  Mild pelvic cramps, pelvic pressure, or nagging pain in the abdominal area.  Nausea, vomiting, or diarrhea that lasts for 24 hours or longer.  A bad-smelling vaginal discharge.  Pain when you urinate.  Known exposure to a contagious illness, such as chickenpox, measles, Zika virus, HIV, or hepatitis. Get help right away if you have:  Spotting or bleeding from your vagina.  Severe abdominal cramping or pain.  Shortness of breath or chest pain.  Any kind of trauma, such as from a fall or a car crash.  New or increased pain, swelling, or redness in an arm or leg. Summary  The first trimester of pregnancy starts on the first day of your last menstrual period until the end of week 12 (months 1 through 3).  Eating 4 or 5 small meals a day rather than 3 large meals may help to relieve nausea and vomiting.  Do not use any products that contain nicotine or tobacco, such as cigarettes, e-cigarettes, and chewing tobacco. If you need help quitting, ask your health care provider.  Keep all follow-up visits. This is important. This information is not intended to replace advice given to you by your health care provider. Make sure you discuss any questions you have with your health care provider. Document Revised: 03/09/2020 Document Reviewed: 01/14/2020 Elsevier Patient Education  2021 Elsevier Inc.        Alpha-Fetoprotein  Test Why am I having this test? The alpha-fetoprotein test is a lab test most commonly used for pregnant women to help screen for birth defects in their unborn baby. It can be used to screen for chromosome (DNA) abnormalities, problems with the brain or spinal cord, or problems with the abdominal wall of the unborn baby (fetus). The alpha-fetoprotein test may also be done for men or nonpregnant women to check for certain cancers. What is being tested? This test measures the amount of alpha-fetoprotein (AFP) in your blood. AFP is a protein that is made by the liver. Levels can be detected in the mother's blood during pregnancy, starting at 10 weeks and peaking at 16-18 weeks of the pregnancy. Abnormal levels can sometimes be a  sign of a birth defect in the baby. Certain cancers can cause a high level of AFP in men and nonpregnant women. What kind of sample is taken? A blood sample is required for this test. It is usually collected by inserting a needle into a blood vessel.   How are the results reported? Your test results will be reported as values. Your health care provider will compare your results to normal ranges that were established after testing a large group of people (reference values). Reference values may vary among labs and hospitals. For this test, common reference values are:  Adult: Less than 40 ng/mL or less than 40 mcg/L (SI units).  Child younger than 1 year: Less than 30 ng/mL. If you are pregnant, the values may also vary based on how long you have been pregnant. What do the results mean? Results that are above the reference values in pregnant women may indicate the following for the baby:  Neural tube defects, such as abnormalities of the spinal cord or brain.  Abdominal wall defects.  Multiple pregnancy such as twins.  Fetal distress or fetal death. Results that are above the reference values in men or nonpregnant women may indicate:  Reproductive cancers, such as  ovarian or testicular cancer.  Liver cancer.  Liver cell death.  Other types of cancer. Very low levels of AFP in pregnant women may indicate Down syndrome for the baby. Talk with your health care provider about what your results mean. Questions to ask your health care provider Ask your health care provider, or the department that is doing the test:  When will my results be ready?  How will I get my results?  What are my treatment options?  What other tests do I need?  What are my next steps? Summary  The alpha-fetoprotein test is done on pregnant women to help screen for birth defects in their unborn baby.  Certain cancers can cause a high level of AFP in men and nonpregnant women.  For this test, a blood sample is usually collected by inserting a needle into a blood vessel.  Talk with your health care provider about what your results mean. This information is not intended to replace advice given to you by your health care provider. Make sure you discuss any questions you have with your health care provider. Document Revised: 04/22/2020 Document Reviewed: 04/22/2020 Elsevier Patient Education  2021 [employer].

## 2021-01-04 NOTE — Progress Notes (Signed)
History:   Jenny Carr is a 35 y.o. G3P2002 at [redacted]w[redacted]d by early ultrasound being seen today for her first obstetrical visit.  Her obstetrical history is significant for obesity, hx PTD. Patient does intend to breast feed. Pregnancy history fully reviewed.  Pt reports this is a desired and planned pregnancy. Allergies: NKDA Current Medications: PNV, ABX for UTI PMH: none. No HTN, DM, asthma. PSH: none OB Hx: 2012 (pre-term 36 weeks, NSVD, no complications), 2016 (full-term, NSVD, no complications) Social Hx: pt does not smoke, drink, or use drugs. Family Hx: none  Patient reports no complaints.      HISTORY: OB History  Gravida Para Term Preterm AB Living  3 2 2  0 0 2  SAB IAB Ectopic Multiple Live Births  0 0 0 0 2    # Outcome Date GA Lbr Len/2nd Weight Sex Delivery Anes PTL Lv  3 Current           2 Term 09/18/15 [redacted]w[redacted]d 04:47 / 00:27 8 lb 4.3 oz (3.751 kg) F Vag-Spont EPI  LIV     Name: Haugan,GIRL Mairany     Apgar1: 8  Apgar5: 9  1 Term 04/30/11 [redacted]w[redacted]d 14:32 / 00:56 6 lb 4 oz (2.835 kg) M Vag-Spont EPI  LIV     Name: Stehlin,BOY Marleni     Apgar1: 8  Apgar5: 9    Last pap smear was done 07/08/2020 and was normal  Past Medical History:  Diagnosis Date  . Medical history non-contributory    No past surgical history on file. Family History  Problem Relation Age of Onset  . Cancer Father    Social History   Tobacco Use  . Smoking status: Never Smoker  . Smokeless tobacco: Never Used  Vaping Use  . Vaping Use: Never used  Substance Use Topics  . Alcohol use: Not Currently    Comment: social  . Drug use: Never   No Known Allergies Current Outpatient Medications on File Prior to Visit  Medication Sig Dispense Refill  . cefadroxil (DURICEF) 500 MG capsule Take 1 capsule (500 mg total) by mouth 2 (two) times daily. 14 capsule 0  . Prenatal 27-1 MG TABS Take 1 tablet by mouth daily. 30 tablet 12  . cyclobenzaprine (FLEXERIL) 5 MG tablet Take 1 tablet (5  mg total) by mouth at bedtime. (Patient not taking: Reported on 12/20/2020) 10 tablet 0  . HYDROcodone-acetaminophen (NORCO/VICODIN) 5-325 MG tablet Take 1 tablet by mouth every 4 (four) hours as needed. (Patient not taking: Reported on 12/20/2020) 6 tablet 0  . ibuprofen (ADVIL,MOTRIN) 600 MG tablet Take 1 tablet (600 mg total) by mouth every 6 (six) hours. (Patient not taking: Reported on 12/20/2020) 90 tablet 0  . methocarbamol (ROBAXIN) 500 MG tablet Take 1 tablet (500 mg total) by mouth 2 (two) times daily. (Patient not taking: Reported on 12/20/2020) 20 tablet 0  . naproxen (NAPROSYN) 500 MG tablet Take 1 tablet (500 mg total) by mouth 2 (two) times daily. (Patient not taking: Reported on 12/20/2020) 30 tablet 0   No current facility-administered medications on file prior to visit.    Review of Systems Pertinent items noted in HPI and remainder of comprehensive ROS otherwise negative.  Physical Exam:   Vitals:   01/04/21 0817  BP: 133/83  Pulse: 83  Temp: 98.4 F (36.9 C)  Weight: 252 lb 9.6 oz (114.6 kg)     Bedside Ultrasound for FHR check: Viable intrauterine pregnancy with positive cardiac activity noted, fetal  heart rate 140sbpm Patient informed that the ultrasound is considered a limited obstetric ultrasound and is not intended to be a complete ultrasound exam.  Patient also informed that the ultrasound is not being completed with the intent of assessing for fetal or placental anomalies or any pelvic abnormalities.  Explained that the purpose of today's ultrasound is to assess for fetal heart rate.  Patient acknowledges the purpose of the exam and the limitations of the study. General: well-developed, well-nourished female in no acute distress  Breasts:  normal appearance, no masses or tenderness bilaterally  Skin: normal coloration and turgor, no rashes  Neurologic: oriented, normal, negative, normal mood  Extremities: normal strength, tone, and muscle mass, ROM of all joints is  normal  HEENT PERRLA, extraocular movement intact and sclera clear, anicteric  Neck supple and no masses  Cardiovascular: regular rate and rhythm  Respiratory:  no respiratory distress, normal breath sounds  Abdomen: soft, non-tender; bowel sounds normal; no masses,  no organomegaly  Pelvic: deferred      Assessment:    Pregnancy: [id number] Patient Active Problem List   Diagnosis Date Noted  . UTI (urinary tract infection) during pregnancy 12/23/2020  . Supervision of other normal pregnancy, antepartum 12/20/2020  . Obesity (BMI 30-39.9) 04/30/2011     Plan:    1. Supervision of other normal pregnancy, antepartum - Urine cytology ancillary only([place]) - Genetic Screening - US MFM OB DETAIL +14 WK; Future  2. [redacted] weeks gestation of pregnancy  3. Urinary tract infection in mother during first trimester of pregnancy -pt still taking ABX, TOC next visit  Initial labs drawn. Continue prenatal vitamins. Problem list reviewed and updated. Genetic Screening discussed, NIPS: ordered. Ultrasound discussed; fetal anatomic survey: ordered. Anticipatory guidance about prenatal visits given including labs, ultrasounds, and testing. Discussed usage of Babyscripts and virtual visits as additional source of managing and completing prenatal visits in midst of coronavirus and pandemic.   Encouraged to complete MyChart Registration for her ability to review results, send requests, and have questions addressed.  The nature of [place] [facility] Closed Healthcare/Faculty Practice with multiple MDs and Advanced Practice Providers was explained to patient; also emphasized that residents, students are part of our team. Routine obstetric precautions reviewed. Encouraged to seek out care at office or emergency room [facility] preferred) for urgent and/or emergent concerns. Return in about 5 weeks (around 02/08/2021) for in-person LOB/APP OK/AFP, needs anatomy scan scheduled.     Marylen Ponto, NP  8:49 AM 01/04/2021

## 2021-01-05 LAB — URINE CYTOLOGY ANCILLARY ONLY
Chlamydia: NEGATIVE
Comment: NEGATIVE
Comment: NORMAL
Neisseria Gonorrhea: NEGATIVE

## 2021-01-17 ENCOUNTER — Encounter: Payer: Self-pay | Admitting: Women's Health

## 2021-01-17 DIAGNOSIS — Z148 Genetic carrier of other disease: Secondary | ICD-10-CM | POA: Insufficient documentation

## 2021-01-17 DIAGNOSIS — D563 Thalassemia minor: Secondary | ICD-10-CM | POA: Insufficient documentation

## 2021-02-08 ENCOUNTER — Ambulatory Visit (INDEPENDENT_AMBULATORY_CARE_PROVIDER_SITE_OTHER): Payer: No Typology Code available for payment source | Admitting: Obstetrics and Gynecology

## 2021-02-08 ENCOUNTER — Other Ambulatory Visit: Payer: Self-pay

## 2021-02-08 VITALS — BP 122/75 | HR 98 | Temp 97.7°F | Wt 259.4 lb

## 2021-02-08 DIAGNOSIS — O99210 Obesity complicating pregnancy, unspecified trimester: Secondary | ICD-10-CM

## 2021-02-08 DIAGNOSIS — Z3A15 15 weeks gestation of pregnancy: Secondary | ICD-10-CM

## 2021-02-08 DIAGNOSIS — Z3480 Encounter for supervision of other normal pregnancy, unspecified trimester: Secondary | ICD-10-CM

## 2021-02-08 MED ORDER — ASPIRIN 81 MG PO CHEW: 81.0000 mg | CHEWABLE_TABLET | Freq: Every day | ORAL | 6 refills | Status: DC

## 2021-02-08 NOTE — Progress Notes (Signed)
   LOW-RISK PREGNANCY OFFICE VISIT Patient name: Jenny Carr MRN [id number]  Date of birth: [birth year] Chief Complaint:   Routine Prenatal Visit  History of Present Illness:   Jenny Carr is a 35 y.o. G10P2002 female at [redacted]w[redacted]d with an Estimated Date of Delivery: 08/01/21 being seen today for ongoing management of a low-risk pregnancy.  Today she reports she had "spotting" a couple of times last week, but none now. Contractions: Not present. Vag. Bleeding: None.  Movement: Absent. denies leaking of fluid. Review of Systems:   Pertinent items are noted in HPI Denies abnormal vaginal discharge w/ itching/odor/irritation, headaches, visual changes, shortness of breath, chest pain, abdominal pain, severe nausea/vomiting, or problems with urination or bowel movements unless otherwise stated above. Pertinent History Reviewed:  Reviewed past medical,surgical, social, obstetrical and family history.  Reviewed problem list, medications and allergies. Physical Assessment:   Vitals:   02/08/21 0911  BP: 122/75  Pulse: 98  Temp: 97.7 F (36.5 C)  Weight: 259 lb 6.4 oz (117.7 kg)  Body mass index is 49.01 kg/m.        Physical Examination:   General appearance: Well appearing, and in no distress  Mental status: Alert, oriented to person, place, and time  Skin: Warm & dry  Cardiovascular: Normal heart rate noted  Respiratory: Normal respiratory effort, no distress  Abdomen: Soft, gravid, nontender  Pelvic: Cervical exam deferred         Extremities: Edema: None  Fetal Status:     Movement: Absent    No results found for this or any previous visit (from the past 24 hour(s)).  Assessment & Plan:  1) Low-risk pregnancy G3P2002 at [redacted]w[redacted]d with an Estimated Date of Delivery: 08/01/21   2) Supervision of other normal pregnancy, antepartum  - AFP, Serum, Open Spina Bifida - Anatomy U/S scheduled for 03/06/2021  3) [redacted] weeks gestation of pregnancy   4) Obesity affecting pregnancy,  antepartum  - Rx for aspirin 81 MG chewable tablet,  - Referral to Nutrition and Diabetes Services    Meds:  Meds ordered this encounter  Medications  . aspirin 81 MG chewable tablet    Sig: Chew 1 tablet (81 mg total) by mouth daily.    Dispense:  30 tablet    Refill:  6    Order Specific Question:   Supervising Provider    Answer:   Reva Bores [[id number]]   Labs/procedures today: none  Plan:  Continue routine obstetrical care   Reviewed: Preterm labor symptoms and general obstetric precautions including but not limited to vaginal bleeding, contractions, leaking of fluid and fetal movement were reviewed in detail with the patient.  All questions were answered. Has home bp cuff. Check bp weekly, let us know if >140/90.   Follow-up: Return in about 4 weeks (around 03/08/2021) for Return OB - My Chart video.  Orders Placed This Encounter  Procedures  . AFP, Serum, Open Spina Bifida  . Referral to Nutrition and Diabetes Services   Raelyn Mora MSN, [place] 02/09/2021

## 2021-02-10 LAB — AFP, SERUM, OPEN SPINA BIFIDA
AFP MoM: 1.68
AFP Value: 40.8 ng/mL
Gest. Age on Collection Date: 15 weeks
Maternal Age At EDD: 33.6 yr
OSBR Risk 1 IN: 3427
Test Results:: NEGATIVE
Weight: 259 [lb_av]

## 2021-02-21 ENCOUNTER — Ambulatory Visit: Payer: No Typology Code available for payment source | Admitting: Registered"

## 2021-02-21 ENCOUNTER — Encounter: Payer: No Typology Code available for payment source | Attending: Obstetrics and Gynecology | Admitting: Registered"

## 2021-02-21 ENCOUNTER — Other Ambulatory Visit: Payer: Self-pay

## 2021-02-21 VITALS — Wt 256.0 lb

## 2021-02-21 DIAGNOSIS — O99210 Obesity complicating pregnancy, unspecified trimester: Secondary | ICD-10-CM | POA: Insufficient documentation

## 2021-02-21 DIAGNOSIS — Z3A00 Weeks of gestation of pregnancy not specified: Secondary | ICD-10-CM | POA: Diagnosis not present

## 2021-02-21 NOTE — Progress Notes (Signed)
Medical Nutrition Therapy  Appointment Start time:  1020  Appointment End time:  1050  Primary concerns today: Eat healthy during pregnancy; EDD 08/01/2021; [redacted]w[redacted]d Referral diagnosis: O99.210 (ICD-10-CM) - Obesity affecting pregnancy, antepartum Preferred learning style: no preference indicated Learning readiness: ready   NUTRITION ASSESSMENT   Anthropometrics  Wt Readings from Last 3 Encounters:  02/21/21 256 lb (116.1 kg)  02/08/21 259 lb 6.4 oz (117.7 kg)  01/04/21 252 lb 9.6 oz (114.6 kg)    Clinical Medical Hx: reviewed chart Medications: reviewed Labs: no recently abnormal except UTI Notable Signs/Symptoms: none  Lifestyle & Dietary Hx Pt states she works from home and doesn't take many breaks for meals/snacks or exercise. Pt states since pregnancy she has reduced appetite and has lost her cravings for chips. Pt states she mostly has cheese or fruit for snacks.  Supplements: prenatal vitamin Sleep: 8+ hours Stress / self-care: not assessed Current average weekly physical activity: ADLs  24-Hr Dietary Recall First Meal: cheese stick Snack: fruit Second Meal: doesn't take break, just snacks Snack: none Third Meal: cabbage, meatballs, mashed potatoes Snack: not usually, stopped eating chips Beverages: water, juice, occasional coke  Estimated Energy Needs Calories:~ 2000  NUTRITION DIAGNOSIS  NB-1.1 Food and nutrition-related knowledge deficit As related to recommended nutrient intake for pregnancy.  As evidenced by dietary recall, skipping meals, pt stated learning of new material.   NUTRITION INTERVENTION  Nutrition education (E-1) on the following topics:  . Need for balanced nutrition to support healthy pregnancy  Handouts Provided Include   MyPlate for Moms  Learning Style & Readiness for Change Teaching method utilized: Visual & Auditory  Demonstrated degree of understanding via: Teach Back  Barriers to learning/adherence to lifestyle change:  none  Goals Established by Pt . Aim follow guidelines in handout to get proper nutrition during pregnancy   MONITORING & EVALUATION Dietary intake, weekly physical activity, and weight prn.  Next Steps  Patient is to work on eating more structured meals and include a variety of foods. Call to schedule follow-up visit if desired.

## 2021-03-01 ENCOUNTER — Other Ambulatory Visit: Payer: Self-pay

## 2021-03-06 ENCOUNTER — Other Ambulatory Visit: Payer: Self-pay | Admitting: Obstetrics and Gynecology

## 2021-03-06 ENCOUNTER — Other Ambulatory Visit: Payer: Self-pay

## 2021-03-06 ENCOUNTER — Encounter: Payer: Self-pay | Admitting: *Deleted

## 2021-03-06 ENCOUNTER — Ambulatory Visit: Payer: No Typology Code available for payment source | Attending: Women's Health

## 2021-03-06 ENCOUNTER — Ambulatory Visit: Payer: No Typology Code available for payment source | Admitting: *Deleted

## 2021-03-06 DIAGNOSIS — Z148 Genetic carrier of other disease: Secondary | ICD-10-CM | POA: Insufficient documentation

## 2021-03-06 DIAGNOSIS — D563 Thalassemia minor: Secondary | ICD-10-CM

## 2021-03-06 DIAGNOSIS — O99212 Obesity complicating pregnancy, second trimester: Secondary | ICD-10-CM

## 2021-03-06 DIAGNOSIS — Z3480 Encounter for supervision of other normal pregnancy, unspecified trimester: Secondary | ICD-10-CM

## 2021-03-06 DIAGNOSIS — O359XX0 Maternal care for (suspected) fetal abnormality and damage, unspecified, not applicable or unspecified: Secondary | ICD-10-CM

## 2021-03-08 ENCOUNTER — Telehealth (INDEPENDENT_AMBULATORY_CARE_PROVIDER_SITE_OTHER): Payer: No Typology Code available for payment source | Admitting: Obstetrics and Gynecology

## 2021-03-08 VITALS — BP 120/77

## 2021-03-08 DIAGNOSIS — Z3A19 19 weeks gestation of pregnancy: Secondary | ICD-10-CM

## 2021-03-08 DIAGNOSIS — Z3480 Encounter for supervision of other normal pregnancy, unspecified trimester: Secondary | ICD-10-CM

## 2021-03-08 DIAGNOSIS — O99012 Anemia complicating pregnancy, second trimester: Secondary | ICD-10-CM

## 2021-03-08 DIAGNOSIS — D563 Thalassemia minor: Secondary | ICD-10-CM

## 2021-03-08 NOTE — Progress Notes (Signed)
MY CHART VIDEO VIRTUAL OBSTETRICS VISIT ENCOUNTER NOTE  I connected with Jenny Carr on 03/09/21 at  1:50 PM EDT by My Chart video at work and verified that I am speaking with the correct person using two identifiers. Provider located at [employer] for [employer] at [place].   I discussed the limitations, risks, security and privacy concerns of performing an evaluation and management service by My Chart video and the availability of in person appointments. I also discussed with the patient that there may be a patient responsible charge related to this service. The patient expressed understanding and agreed to proceed.  I discussed the limitations of telemedicine and the availability of in person appointments.  Discussed there is a possibility of technology failure and discussed alternative modes of communication if that failure occurs.  Subjective:  Jenny Carr is a 35 y.o. G3P2002 at [redacted]w[redacted]d being followed for ongoing prenatal care.  She is currently monitored for the following issues for this low-risk pregnancy and has Obesity (BMI 30-39.9); Obesity affecting pregnancy, antepartum; Supervision of other normal pregnancy, antepartum; UTI (urinary tract infection) during pregnancy; Alpha thalassemia silent carrier; and Genetic carrier on their problem list.  Patient reports some cramping. Reports fetal movement. Denies any contractions, bleeding or leaking of fluid.   The following portions of the patient's history were reviewed and updated as appropriate: allergies, current medications, past family history, past medical history, past social history, past surgical history and problem list.   Objective:   General:  Alert, oriented and cooperative.   Mental Status: Normal mood and affect perceived. Normal judgment and thought content.  Rest of physical exam deferred due to type of encounter  BP 120/77   LMP  (LMP Unknown)  **Done by patient's own at home BP cuff and  scale  Assessment and Plan:  Pregnancy: G3P2002 at [redacted]w[redacted]d  1. Supervision of other normal pregnancy, antepartum - U/S scheduled for 04/03/2021 - Discussed some cramping is normal - Advised to try soaking a tub of warm water, taking Tylenol 1000 mg prn pain and rest  2. Alpha thalassemia silent carrier - Scheduled with genetic counselor on 04/03/2021  3. [redacted] weeks gestation of pregnancy       Preterm labor symptoms and general obstetric precautions including but not limited to vaginal bleeding, contractions, leaking of fluid and fetal movement were reviewed in detail with the patient.  I discussed the assessment and treatment plan with the patient. The patient was provided an opportunity to ask questions and all were answered. The patient agreed with the plan and demonstrated an understanding of the instructions. The patient was advised to call back or seek an in-person office evaluation/go to [facility] at [facility] for any urgent or concerning symptoms. Please refer to After Visit Summary for other counseling recommendations.   I provided 5 minutes of non-face-to-face time during this encounter. There was 5 minutes of chart review time spent prior to this encounter. Total time spent = 10 minutes.  Return in about 3 weeks (around 03/29/2021) for Return OB - My Chart video.  Future Appointments  Date Time Provider Department Center  04/03/2021  9:30 AM [facility] NURSE [facility]  04/03/2021  9:45 AM [facility] US5 [facility]  04/03/2021 10:30 AM [facility] GENETIC COUNSELING [facility]  04/13/2021 10:50 AM Raelyn Mora, CNM [facility] None  05/11/2021  8:10 AM Raelyn Mora, CNM [facility] None    Raelyn Mora, CNM Center for [employer], [facility]

## 2021-03-09 ENCOUNTER — Encounter: Payer: Self-pay | Admitting: Obstetrics and Gynecology

## 2021-04-03 ENCOUNTER — Other Ambulatory Visit: Payer: Self-pay

## 2021-04-03 ENCOUNTER — Ambulatory Visit: Payer: No Typology Code available for payment source | Admitting: *Deleted

## 2021-04-03 ENCOUNTER — Ambulatory Visit: Payer: No Typology Code available for payment source | Attending: Obstetrics and Gynecology

## 2021-04-03 ENCOUNTER — Other Ambulatory Visit: Payer: Self-pay | Admitting: *Deleted

## 2021-04-03 ENCOUNTER — Encounter: Payer: Self-pay | Admitting: *Deleted

## 2021-04-03 VITALS — BP 130/69 | HR 89

## 2021-04-03 DIAGNOSIS — Z3480 Encounter for supervision of other normal pregnancy, unspecified trimester: Secondary | ICD-10-CM | POA: Diagnosis not present

## 2021-04-03 DIAGNOSIS — Z3A22 22 weeks gestation of pregnancy: Secondary | ICD-10-CM | POA: Diagnosis not present

## 2021-04-03 DIAGNOSIS — D563 Thalassemia minor: Secondary | ICD-10-CM

## 2021-04-03 DIAGNOSIS — Z6841 Body Mass Index (BMI) 40.0 and over, adult: Secondary | ICD-10-CM

## 2021-04-03 DIAGNOSIS — Z148 Genetic carrier of other disease: Secondary | ICD-10-CM | POA: Insufficient documentation

## 2021-04-03 DIAGNOSIS — O359XX0 Maternal care for (suspected) fetal abnormality and damage, unspecified, not applicable or unspecified: Secondary | ICD-10-CM | POA: Diagnosis not present

## 2021-04-03 DIAGNOSIS — O99212 Obesity complicating pregnancy, second trimester: Secondary | ICD-10-CM | POA: Diagnosis not present

## 2021-04-13 ENCOUNTER — Other Ambulatory Visit: Payer: Self-pay

## 2021-04-13 ENCOUNTER — Encounter: Payer: Self-pay | Admitting: Medical

## 2021-04-13 ENCOUNTER — Ambulatory Visit (INDEPENDENT_AMBULATORY_CARE_PROVIDER_SITE_OTHER): Payer: No Typology Code available for payment source | Admitting: Medical

## 2021-04-13 VITALS — BP 118/78 | HR 103 | Temp 97.7°F | Wt 263.2 lb

## 2021-04-13 DIAGNOSIS — Z148 Genetic carrier of other disease: Secondary | ICD-10-CM

## 2021-04-13 DIAGNOSIS — Z3A24 24 weeks gestation of pregnancy: Secondary | ICD-10-CM

## 2021-04-13 DIAGNOSIS — O2341 Unspecified infection of urinary tract in pregnancy, first trimester: Secondary | ICD-10-CM

## 2021-04-13 DIAGNOSIS — O99210 Obesity complicating pregnancy, unspecified trimester: Secondary | ICD-10-CM

## 2021-04-13 DIAGNOSIS — Z3480 Encounter for supervision of other normal pregnancy, unspecified trimester: Secondary | ICD-10-CM

## 2021-04-13 DIAGNOSIS — D563 Thalassemia minor: Secondary | ICD-10-CM

## 2021-04-13 NOTE — Progress Notes (Signed)
   PRENATAL VISIT NOTE  Subjective:  Jenny Carr is a 35 y.o. G3P2002 at [redacted]w[redacted]d being seen today for ongoing prenatal care.  She is currently monitored for the following issues for this low-risk pregnancy and has Obesity (BMI 30-39.9); Obesity affecting pregnancy, antepartum; Supervision of other normal pregnancy, antepartum; UTI (urinary tract infection) during pregnancy; Alpha thalassemia silent carrier; and Genetic carrier on their problem list.  Patient reports no complaints.  Contractions: Not present. Vag. Bleeding: None.  Movement: Present. Denies leaking of fluid.   The following portions of the patient's history were reviewed and updated as appropriate: allergies, current medications, past family history, past medical history, past social history, past surgical history and problem list.   Objective:   Vitals:   04/13/21 0824  BP: 118/78  Pulse: (!) 103  Temp: 97.7 F (36.5 C)  Weight: 263 lb 3.2 oz (119.4 kg)    Fetal Status: Fetal Heart Rate (bpm): 138 Fundal Height: 27 cm Movement: Present     General:  Alert, oriented and cooperative. Patient is in no acute distress.  Skin: Skin is warm and dry. No rash noted.   Cardiovascular: Normal heart rate noted  Respiratory: Normal respiratory effort, no problems with respiration noted  Abdomen: Soft, gravid, appropriate for gestational age.  Pain/Pressure: Absent     Pelvic: Cervical exam deferred        Extremities: Normal range of motion.  Edema: None  Mental Status: Normal mood and affect. Normal behavior. Normal judgment and thought content.   Assessment and Plan:  Pregnancy: G3P2002 at [redacted]w[redacted]d 1. Urinary tract infection in mother during first trimester of pregnancy - Culture, OB Urine - TOC today, patient will be notified of results via MyChart  2. Supervision of other normal pregnancy, antepartum - Anticipatory guidance for 2 hour GTT, CBC, RPR and HIV at next visit, also discussed Tdap   3. Obesity affecting  pregnancy, antepartum - TWG 33# - Discussed healthy diet in pregnancy  - Taking ASA - Follow-up US 05/08/21  4. Genetic carrier - Increased risk of SMA - Declined partner testing or GC  5. Alpha thalassemia silent carrier - declined partner testing or GC   6. [redacted] weeks gestation of pregnancy  Preterm labor symptoms and general obstetric precautions including but not limited to vaginal bleeding, contractions, leaking of fluid and fetal movement were reviewed in detail with the patient. Please refer to After Visit Summary for other counseling recommendations.   Return in about 4 weeks (around 05/11/2021) for LOB, In-Person, 28 week labs (fasting).  Future Appointments  Date Time Provider Department Center  04/13/2021 10:50 AM Kathlene Cote [facility] None  05/08/2021  8:30 AM [facility] NURSE [facility]  05/08/2021  8:45 AM [facility] US4 [facility]  05/11/2021  8:10 AM Raelyn Mora, CNM [facility] None  06/07/2021  9:30 AM Bernerd Limbo, CNM [facility] None    Vonzella Nipple, PA-C

## 2021-04-15 LAB — CULTURE, OB URINE

## 2021-04-15 LAB — URINE CULTURE, OB REFLEX

## 2021-05-08 ENCOUNTER — Other Ambulatory Visit: Payer: Self-pay | Admitting: *Deleted

## 2021-05-08 ENCOUNTER — Other Ambulatory Visit: Payer: Self-pay

## 2021-05-08 ENCOUNTER — Ambulatory Visit: Payer: No Typology Code available for payment source | Attending: Obstetrics

## 2021-05-08 ENCOUNTER — Ambulatory Visit: Payer: No Typology Code available for payment source | Admitting: *Deleted

## 2021-05-08 VITALS — BP 125/65 | HR 105

## 2021-05-08 DIAGNOSIS — Z3480 Encounter for supervision of other normal pregnancy, unspecified trimester: Secondary | ICD-10-CM | POA: Insufficient documentation

## 2021-05-08 DIAGNOSIS — O99212 Obesity complicating pregnancy, second trimester: Secondary | ICD-10-CM

## 2021-05-08 DIAGNOSIS — O321XX0 Maternal care for breech presentation, not applicable or unspecified: Secondary | ICD-10-CM

## 2021-05-08 DIAGNOSIS — Z6841 Body Mass Index (BMI) 40.0 and over, adult: Secondary | ICD-10-CM

## 2021-05-08 DIAGNOSIS — D563 Thalassemia minor: Secondary | ICD-10-CM | POA: Insufficient documentation

## 2021-05-08 DIAGNOSIS — Z3A27 27 weeks gestation of pregnancy: Secondary | ICD-10-CM

## 2021-05-08 DIAGNOSIS — Z148 Genetic carrier of other disease: Secondary | ICD-10-CM

## 2021-05-08 DIAGNOSIS — Z362 Encounter for other antenatal screening follow-up: Secondary | ICD-10-CM | POA: Insufficient documentation

## 2021-05-11 ENCOUNTER — Encounter: Payer: Self-pay | Admitting: Obstetrics and Gynecology

## 2021-05-11 ENCOUNTER — Other Ambulatory Visit: Payer: Self-pay

## 2021-05-11 ENCOUNTER — Ambulatory Visit (INDEPENDENT_AMBULATORY_CARE_PROVIDER_SITE_OTHER): Payer: No Typology Code available for payment source | Admitting: Obstetrics and Gynecology

## 2021-05-11 VITALS — BP 114/81 | HR 103 | Temp 98.0°F | Wt 260.0 lb

## 2021-05-11 DIAGNOSIS — O99210 Obesity complicating pregnancy, unspecified trimester: Secondary | ICD-10-CM

## 2021-05-11 DIAGNOSIS — Z3480 Encounter for supervision of other normal pregnancy, unspecified trimester: Secondary | ICD-10-CM | POA: Diagnosis not present

## 2021-05-11 DIAGNOSIS — D563 Thalassemia minor: Secondary | ICD-10-CM

## 2021-05-11 DIAGNOSIS — Z148 Genetic carrier of other disease: Secondary | ICD-10-CM

## 2021-05-11 DIAGNOSIS — Z23 Encounter for immunization: Secondary | ICD-10-CM

## 2021-05-11 NOTE — Patient Instructions (Addendum)
Iron-Rich Diet  Iron is a mineral that helps your body produce hemoglobin. Hemoglobin is a protein in red blood cells that carries oxygen to your body's tissues. Eating too little iron may cause you to feel weak and tired, and it can increase your risk of infection. Iron is naturally found in many foods, and many foods have iron added to them (are iron-fortified). You may need to follow an iron-rich diet if you do not have enough iron in your body due to certain medical conditions. The amount of iron that you need each day depends on your age, your sex, and any medical conditions you have. Follow instructions from your health care provider or a dietitian about how much ironyou should eat each day. What are tips for following this plan? Reading food labels Check food labels to see how many milligrams (mg) of iron are in each serving. Cooking Cook foods in pots and pans that are made from iron. Take these steps to make it easier for your body to absorb iron from certain foods: Soak beans overnight before cooking. Soak whole grains overnight and drain them before using. Ferment flours before baking, such as by using yeast in bread dough. Meal planning When you eat foods that contain iron, you should eat them with foods that are high in vitamin C. These include oranges, peppers, tomatoes, potatoes, and mangoes. Vitamin C helps your body absorb iron. Certain foods and drinks prevent your body from absorbing iron properly. Avoid eating these foods in the same meal as iron-rich foods or with iron supplements. These foods include: Coffee, black tea, and red wine. Milk, dairy products, and foods that are high in calcium. Beans and soybeans. Whole grains. General information Take iron supplements only as told by your health care provider. An overdose of iron can be life-threatening. If you were prescribed iron supplements, take them with orange juice or a vitamin C supplement. When you eat iron-fortified  foods or take an iron supplement, you should also eat foods that naturally contain iron, such as meat, poultry, and fish. Eating naturally iron-rich foods helps your body absorb the iron that is added to other foods or contained in a supplement. Iron from animal sources is better absorbed than iron from plant sources. What foods should I eat? Fruits Prunes. Raisins. Eat fruits high in vitamin C, such as oranges, grapefruits, and strawberries,with iron-rich foods. Vegetables Spinach (cooked). Green peas. Broccoli. Fermented vegetables. Eat vegetables high in vitamin C, such as leafy greens, potatoes, bell peppers,and tomatoes, with iron-rich foods. Grains Iron-fortified breakfast cereal. Iron-fortified whole-wheat bread. Enrichedrice. Sprouted grains. Meats and other proteins Beef liver. Beef. Turkey. Chicken. Oysters. Shrimp. Tuna. Sardines. Chickpeas.Nuts. Tofu. Pumpkin seeds. Beverages Tomato juice. Fresh orange juice. Prune juice. Hibiscus tea. Iron-fortifiedinstant breakfast shakes. Sweets and desserts Blackstrap molasses. Seasonings and condiments Tahini. Fermented soy sauce. Other foods Wheat germ. The items listed above may not be a complete list of recommended foods and beverages. Contact a dietitian for more information. What foods should I limit? These are foods that should be limited while eating iron-rich foods as they canreduce the absorption of iron in your body. Grains Whole grains. Bran cereal. Bran flour. Meats and other proteins Soybeans. Products made from soy protein. Black beans. Lentils. Mung beans.Split peas. Dairy Milk. Cream. Cheese. Yogurt. Cottage cheese. Beverages Coffee. Black tea. Red wine. Sweets and desserts Cocoa. Chocolate. Ice cream. Seasonings and condiments Basil. Oregano. Large amounts of parsley. The items listed above may not be a complete list of   foods and beverages you should limit. Contact a dietitian for more information. Summary Iron  is a mineral that helps your body produce hemoglobin. Hemoglobin is a protein in red blood cells that carries oxygen to your body's tissues. Iron is naturally found in many foods, and many foods have iron added to them (are iron-fortified). When you eat foods that contain iron, you should eat them with foods that are high in vitamin C. Vitamin C helps your body absorb iron. Certain foods and drinks prevent your body from absorbing iron properly, such as whole grains and dairy products. You should avoid eating these foods in the same meal as iron-rich foods or with iron supplements. This information is not intended to replace advice given to you by your health care provider. Make sure you discuss any questions you have with your healthcare provider. Document Revised: 09/12/2020 Document Reviewed: 09/12/2020 Elsevier Patient Education  2022 Elsevier Inc.   Fetal Movement Counts Patient Name: ________________________________________________ Patient DueDate: ____________________ What is a fetal movement count?  A fetal movement count is the number of times that you feel your baby move during a certain amount of time. This may also be called a fetal kick count. A fetal movement count is recommended for every pregnant woman. You may be askedto start counting fetal movements as early as week 28 of your pregnancy. Pay attention to when your baby is most active. You may notice your baby's sleep and wake cycles. You may also notice things that make your baby move more. You should do a fetal movement count: When your baby is normally most active. At the same time each day. A good time to count movements is while you are resting, after having somethingto eat and drink. How do I count fetal movements? Find a quiet, comfortable area. Sit, or lie down on your side. Write down the date, the start time and stop time, and the number of movements that you felt between those two times. Take this information with  you to your health care visits. Write down your start time when you feel the first movement. Count kicks, flutters, swishes, rolls, and jabs. You should feel at least 10 movements. You may stop counting after you have felt 10 movements, or if you have been counting for 2 hours. Write down the stop time. If you do not feel 10 movements in 2 hours, contact your health care provider for further instructions. Your health care provider may want to do additional tests to assess your baby's well-being. Contact a health care provider if: You feel fewer than 10 movements in 2 hours. Your baby is not moving like he or she usually does. Date: ____________ Start time: ____________ Stop time: ____________ Movements:____________ Date: ____________ Start time: ____________ Stop time: ____________ Movements:____________ Date: ____________ Start time: ____________ Stop time: ____________ Movements:____________ Date: ____________ Start time: ____________ Stop time: ____________ Movements:____________ Date: ____________ Start time: ____________ Stop time: ____________ Movements:____________ Date: ____________ Start time: ____________ Stop time: ____________ Movements:____________ Date: ____________ Start time: ____________ Stop time: ____________ Movements:____________ Date: ____________ Start time: ____________ Stop time: ____________ Movements:____________ Date: ____________ Start time: ____________ Stop time: ____________ Movements:____________ This information is not intended to replace advice given to you by your health care provider. Make sure you discuss any questions you have with your healthcare provider. Document Revised: 05/21/2019 Document Reviewed: 05/21/2019 Elsevier Patient Education  2022 [employer].   Third Trimester of Pregnancy  The third trimester of pregnancy is from week 28 through week 40. This is  of Pregnancy The third trimester of pregnancy is from week 28 through week 40. This is months 7 through 9. The third trimester is a time when the  unborn baby (fetus) is growing rapidly. At the end of the ninth month, the fetus is about 20 inches long and weighs 6-10 pounds. Body changes during your third trimester During the third trimester, your body will continue to go through many changes. The changes vary and generally return to normal after your baby is born. Physical changes Your weight will continue to increase. You can expect to gain 25-35 pounds (11-16 kg) by the end of the pregnancy if you begin pregnancy at a normal weight. If you are underweight, you can expect to gain 28-40 lb (about 13-18 kg), and if you are overweight, you can expect to gain 15-25 lb (about 7-11 kg). You may begin to get stretch marks on your hips, abdomen, and breasts. Your breasts will continue to grow and may hurt. A yellow fluid (colostrum) may leak from your breasts. This is the first milk you are producing for your baby. You may have changes in your hair. These can include thickening of your hair, rapid growth, and changes in texture. Some people also have hair loss during or after pregnancy, or hair that feels dry or thin. Your belly button may stick out. You may notice more swelling in your hands, face, or ankles. Health changes You may have heartburn. You may have constipation. You may develop hemorrhoids. You may develop swollen, bulging veins (varicose veins) in your legs. You may have increased body aches in the pelvis, back, or thighs. This is due to weight gain and increased hormones that are relaxing your joints. You may have increased tingling or numbness in your hands, arms, and legs. The skin on your abdomen may also feel numb. You may feel short of breath because of your expanding uterus. Other changes You may urinate more often because the fetus is moving lower into your pelvis and pressing on your bladder. You may have more problems sleeping. This may be caused by the size of your abdomen, an increased need to urinate, and an increase in  your body's metabolism. You may notice the fetus "dropping," or moving lower in your abdomen (lightening). You may have increased vaginal discharge. You may notice that you have pain around your pelvic bone as your uterus distends. Follow these instructions at home: Medicines Follow your health care provider's instructions regarding medicine use. Specific medicines may be either safe or unsafe to take during pregnancy. Do not take any medicines unless approved by your health care provider. Take a prenatal vitamin that contains at least 600 micrograms (mcg) of folic acid. Eating and drinking Eat a healthy diet that includes fresh fruits and vegetables, whole grains, good sources of protein such as meat, eggs, or tofu, and low-fat dairy products. Avoid raw meat and unpasteurized juice, milk, and cheese. These carry germs that can harm you and your baby. Eat 4 or 5 small meals rather than 3 large meals a day. You may need to take these actions to prevent or treat constipation: Drink enough fluid to keep your urine pale yellow. Eat foods that are high in fiber, such as beans, whole grains, and fresh fruits and vegetables. Limit foods that are high in fat and processed sugars, such as fried or sweet foods. Activity Exercise only as directed by your health care provider. Most people can continue their usual exercise routine during pregnancy. Try to exercise   Stop exercising if you develop pain or cramping in the lower abdomen or lower back. Avoid heavy lifting. Do not exercise if it is very hot or humid or if you are at a high altitude. If you choose to, you may continue to have sex unless your health care provider tells you not to. Relieving pain and discomfort Take frequent breaks and rest with your legs raised (elevated) if you have leg cramps or low back pain. Take warm sitz baths to soothe any pain or discomfort caused by  hemorrhoids. Use hemorrhoid cream if your health care provider approves. Wear a supportive bra to prevent discomfort from breast tenderness. If you develop varicose veins: Wear support hose as told by your health care provider. Elevate your feet for 15 minutes, 3-4 times a day. Limit salt in your diet. Safety Talk to your health care provider before traveling far distances. Do not use hot tubs, steam rooms, or saunas. Wear your seat belt at all times when driving or riding in a car. Talk with your health care provider if someone is verbally or physically abusive to you. Preparing for birth To prepare for the arrival of your baby: Take prenatal classes to understand, practice, and ask questions about labor and delivery. Visit the hospital and tour the maternity area. Purchase a rear-facing car seat and make sure you know how to install it in your car. Prepare the baby's room or sleeping area. Make sure to remove all pillows and stuffed animals from the baby's crib to prevent suffocation. General instructions Avoid cat litter boxes and soil used by cats. These carry germs that can cause birth defects in the baby. If you have a cat, ask someone to clean the litter box for you. Do not douche or use tampons. Do not use scented sanitary pads. Do not use any products that contain nicotine or tobacco, such as cigarettes, e-cigarettes, and chewing tobacco. If you need help quitting, ask your health care provider. Do not use any herbal remedies, illegal drugs, or medicines that were not prescribed to you. Chemicals in these products can harm your baby. Do not drink alcohol. You will have more frequent prenatal exams during the third trimester. During a routine prenatal visit, your health care provider will do a physical exam, perform tests, and discuss your overall health. Keep all follow-up visits. This is important. Where to find more information American Pregnancy Association:  americanpregnancy.org [employer]: https://www.todd-brady.net/ Office on [employer] Health: MightyReward.[employer].nz Contact a health care provider if you have: A fever. Mild pelvic cramps, pelvic pressure, or nagging pain in your abdominal area or lower back. Vomiting or diarrhea. Bad-smelling vaginal discharge or foul-smelling urine. Pain when you urinate. A headache that does not go away when you take medicine. Visual changes or see spots in front of your eyes. Get help right away if: Your water breaks. You have regular contractions less than 5 minutes apart. You have spotting or bleeding from your vagina. You have severe abdominal pain. You have difficulty breathing. You have chest pain. You have fainting spells. You have not felt your baby move for the time period told by your health care provider. You have new or increased pain, swelling, or redness in an arm or leg. Summary The third trimester of pregnancy is from week 28 through week 40 (months 7 through 9). You may have more problems sleeping. This can be caused by the size of your abdomen, an increased need to urinate, and an increase in  can be caused by the size of your abdomen, an increased need to urinate, and an increase in your body's metabolism. You will have more frequent prenatal exams during the third trimester. Keep all follow-up visits. This is important. This information is not intended to replace advice given to you by your health care provider. Make sure you discuss any questions you have with your health care provider. Document Revised: 03/09/2020 Document Reviewed: 01/14/2020 Elsevier Patient Education  2022 Elsevier Inc.  

## 2021-05-11 NOTE — Progress Notes (Addendum)
  HIGH-RISK PREGNANCY OFFICE VISIT Patient name: Jenny Carr MRN [id number]  Date of birth: 07/02/1986 Chief Complaint:   Routine Prenatal Visit  History of Present Illness:   Jenny Carr is a 35 y.o. G24P2002 female at [redacted]w[redacted]d with an Estimated Date of Delivery: 08/01/21 being seen today for ongoing management of a high-risk pregnancy complicated by  obesity Today she reports no complaints. Contractions: Not present. Vag. Bleeding: None.  Movement: Present. denies leaking of fluid.  Review of Systems:   Pertinent items are noted in HPI Denies abnormal vaginal discharge w/ itching/odor/irritation, headaches, visual changes, shortness of breath, chest pain, abdominal pain, severe nausea/vomiting, or problems with urination or bowel movements unless otherwise stated above. Pertinent History Reviewed:  Reviewed past medical,surgical, social, obstetrical and family history.  Reviewed problem list, medications and allergies. Physical Assessment:   Vitals:   05/11/21 0819  BP: 114/81  Pulse: (!) 103  Temp: 98 F (36.7 C)  Weight: 260 lb (117.9 kg)  Body mass index is 49.13 kg/m.           Physical Examination:   General appearance: alert, well appearing, and in no distress, oriented to person, place, and time, and overweight  Mental status: alert, oriented to person, place, and time, normal mood, behavior, speech, dress, motor activity, and thought processes  Skin: warm & dry   Extremities: Edema: None    Cardiovascular: normal heart rate noted  Respiratory: normal respiratory effort, no distress  Abdomen: gravid, soft, non-tender  Pelvic: Cervical exam deferred         Fetal Status: Fetal Heart Rate (bpm): 152 Fundal Height: 28 cm Movement: Present    Fetal Surveillance Testing today: none   No results found for this or any previous visit (from the past 24 hour(s)).  Assessment & Plan:  1) High-risk pregnancy G3P2002 at [redacted]w[redacted]d with an Estimated Date of Delivery: 08/01/21    2) Supervision of other normal pregnancy, antepartum  - Glucose Tolerance, 2 Hours w/1 Hour,  - HIV Antibody (routine testing w rflx),  - RPR,  - CBC,  - Tdap vaccine greater than or equal to 7yo IM - Discussed normal U/S  3) Obesity affecting pregnancy, antepartum - TWG = 30 lbs - Discussed healthy weight gain in obese patient being 11-20 lbs - Encouraged to take a brisk 30-45 mins walk 5 days per week  4) Alpha thalassemia silent carrier - Declined partner testing  5) Genetic carrier  - Declined partner testing  Meds: No orders of the defined types were placed in this encounter.  Labs/procedures today: 2 hr GTT, 3rd trimester labs, TdaP  Treatment Plan:  start weekly BPP testing with MFM @ 36 wks  Reviewed: Preterm labor symptoms and general obstetric precautions including but not limited to vaginal bleeding, contractions, leaking of fluid and fetal movement were reviewed in detail with the patient.  All questions were answered. Has home bp cuff. Check bp weekly, let us know if >140/90.   Follow-up: Return in about 4 weeks (around 06/08/2021) for Return OB - My Chart video.  Orders Placed This Encounter  Procedures   Tdap vaccine greater than or equal to 7yo IM   Glucose Tolerance, 2 Hours w/1 Hour   HIV Antibody (routine testing w rflx)   RPR   CBC   Raelyn Mora MSN, CNM 05/11/2021 8:46 AM

## 2021-05-12 LAB — CBC
Hematocrit: 35 % (ref 34.0–46.6)
Hemoglobin: 11 g/dL — ABNORMAL LOW (ref 11.1–15.9)
MCH: 24 pg — ABNORMAL LOW (ref 26.6–33.0)
MCHC: 31.4 g/dL — ABNORMAL LOW (ref 31.5–35.7)
MCV: 76 fL — ABNORMAL LOW (ref 79–97)
Platelets: 253 10*3/uL (ref 150–450)
RBC: 4.59 x10E6/uL (ref 3.77–5.28)
RDW: 15.4 % (ref 11.7–15.4)
WBC: 6.4 10*3/uL (ref 3.4–10.8)

## 2021-05-12 LAB — GLUCOSE TOLERANCE, 2 HOURS W/ 1HR
Glucose, 1 hour: 143 mg/dL (ref 65–179)
Glucose, 2 hour: 118 mg/dL (ref 65–152)
Glucose, Fasting: 81 mg/dL (ref 65–91)

## 2021-05-12 LAB — RPR: RPR Ser Ql: NONREACTIVE

## 2021-05-12 LAB — HIV ANTIBODY (ROUTINE TESTING W REFLEX): HIV Screen 4th Generation wRfx: NONREACTIVE

## 2021-06-07 ENCOUNTER — Encounter: Payer: Self-pay | Admitting: Certified Nurse Midwife

## 2021-06-07 ENCOUNTER — Telehealth (INDEPENDENT_AMBULATORY_CARE_PROVIDER_SITE_OTHER): Payer: No Typology Code available for payment source | Admitting: Certified Nurse Midwife

## 2021-06-07 VITALS — BP 121/82 | HR 104

## 2021-06-07 DIAGNOSIS — Z3A32 32 weeks gestation of pregnancy: Secondary | ICD-10-CM

## 2021-06-07 DIAGNOSIS — Z3483 Encounter for supervision of other normal pregnancy, third trimester: Secondary | ICD-10-CM

## 2021-06-07 NOTE — Progress Notes (Signed)
OBSTETRICS PRENATAL VIRTUAL VISIT ENCOUNTER NOTE  Provider location: [facility]   Patient location: Home  I connected with Jenny Carr on 06/07/21 at  9:30 AM EDT by MyChart Video Encounter and verified that I am speaking with the correct person using two identifiers. I discussed the limitations, risks, security and privacy concerns of performing an evaluation and management service virtually and the availability of in person appointments. I also discussed with the patient that there may be a patient responsible charge related to this service. The patient expressed understanding and agreed to proceed. Subjective:  Jenny Carr is a 35 y.o. G3P2002 at [redacted]w[redacted]d being seen today for ongoing prenatal care.  She is currently monitored for the following issues for this low-risk pregnancy and has Obesity (BMI 30-39.9); Obesity affecting pregnancy, antepartum; Supervision of other normal pregnancy, antepartum; UTI (urinary tract infection) during pregnancy; Alpha thalassemia silent carrier; and Genetic carrier on their problem list.  Patient reports  some pelvic pressure when turning over in bed at night and mild ankle/foot swelling if on her feet all day. Edema goes away at night .  Contractions: Not present. Vag. Bleeding: None.  Movement: Present. Denies any leaking of fluid.   The following portions of the patient's history were reviewed and updated as appropriate: allergies, current medications, past family history, past medical history, past social history, past surgical history and problem list.   Objective:   Vitals:   06/07/21 0939  BP: 121/82  Pulse: (!) 104  No weight recorded, her scale is malfunctioning and initially recorded a 60lb weight loss.  Fetal Status:     Movement: Present     General:  Alert, oriented and cooperative. Patient is in no acute distress.  Respiratory: Normal respiratory effort, no problems with respiration noted   Mental Status: Normal mood and affect. Normal behavior. Normal judgment and thought content.  Rest of physical exam deferred due to type of encounter  Imaging: No results found.  Assessment and Plan:  Pregnancy: G3P2002 at [redacted]w[redacted]d 1. Encounter for supervision of other normal pregnancy in third trimester - Doing well, feeling regular and vigorous fetal movement  2. [redacted] weeks gestation of pregnancy - Routine OB care - Interested in waterbirth, encouraged her to attend the class, we will go over the consent at a later appointment. Reviewed reasons pts get risked out of waterbirth including gHTN diagnoses. Also reviewed options for non-epidural pain relief. - Pt desires to complete BTL consent, still deciding btw that and Nexplanon, but is likely to want the BTL. Needs to sign consent at next visit.  Preterm labor symptoms and general obstetric precautions including but not limited to vaginal bleeding, contractions, leaking of fluid and fetal movement were reviewed in detail with the patient. I discussed the assessment and treatment plan with the patient. The patient was provided an opportunity to ask questions and all were answered. The patient agreed with the plan and demonstrated an understanding of the instructions. The patient was advised to call back or seek an in-person office evaluation/go to [facility] at [facility], [place] for any urgent or concerning symptoms. Please refer to After Visit Summary for other counseling recommendations.   I provided 10 minutes of face-to-face time during this encounter.  Future Appointments  Date Time Provider Department Center  06/12/2021  8:15 AM [facility]URSE [facility]  06/12/2021  8:45 AM [facility]  06/22/2021 10:35 AM Raelyn Mora, CNM [facility] None  07/06/2021 10:35 AM  Raelyn Mora, CNM [facility] None  07/13/2021 10:35 AM Raelyn Mora, CNM [facility] None    Bernerd Limbo, CNM [facility] [employer]

## 2021-06-07 NOTE — Patient Instructions (Signed)
Jenny Carr w/ [facility] [address] [place], [place] [address] [phone] [email or web address]   Considering Waterbirth? Guide for patients at Center for [facility]) Why consider waterbirth? Gentle birth for babies  Less pain medicine used in labor  May allow for passive descent/less pushing  May reduce perineal tears  More mobility and instinctive maternal position changes  Increased maternal relaxation   Is waterbirth safe? What are the risks of infection, drowning or other complications? Infection:  Very low risk (3.7 % for tub vs 4.8% for bed)  7 in 8000 waterbirths with documented infection  Poorly cleaned equipment most common cause  Slightly lower group B strep transmission rate  Drowning  Maternal:  Very low risk  Related to seizures or fainting  Newborn:  Very low risk. No evidence of increased risk of respiratory problems in multiple large studies  Physiological protection from breathing under water  Avoid underwater birth if there are any fetal complications  Once baby's head is out of the water, keep it out.  Birth complication  Some reports of cord trauma, but risk decreased by bringing baby to surface gradually  No evidence of increased risk of shoulder dystocia. Mothers can usually change positions faster in water than in a bed, possibly aiding the maneuvers to free the shoulder.   There are 2 things you MUST do to have a waterbirth with [facility]: Attend a waterbirth class at [facility]   3rd Wednesday of every month from 7-9 pm (virtual during COVID) [employer] at [email or web address] or [email or web address] or by calling [phone] Bring [place] the certificate from the class to your prenatal appointment or send via MyChart Meet with a midwife at 36 weeks* to see if you can still plan a waterbirth and to sign the  consent.   *We also recommend that you schedule as many of your prenatal visits with a midwife as possible.    Helpful information: You may want to bring a bathing suit top to the hospital to wear during labor but this is optional.  All other supplies are provided by the hospital. Please arrive at the hospital with signs of active labor, and do not wait at home until late in labor. It takes 45 min- 2 hours for COVID testing, fetal monitoring, and check in to your room to take place, plus transport and filling of the waterbirth tub.    Things that would prevent you from having a waterbirth: Unknown or Positive COVID-19 diagnosis upon admission to hospital* Premature, <37wks  Previous cesarean birth  Presence of thick meconium-stained fluid  Multiple gestation (Twins, triplets, etc.)  Uncontrolled diabetes or gestational diabetes requiring medication  Hypertension diagnosed in pregnancy or preexisting hypertension (gestational hypertension, preeclampsia, or chronic hypertension) Fetal growth restriction (your baby measures less than 10th percentile on ultrasound) Heavy vaginal bleeding  Non-reassuring fetal heart rate  Active infection (MRSA, etc.). Group B Strep is NOT a contraindication for waterbirth.  If your labor has to be induced and induction method requires continuous monitoring of the baby's heart rate  Other risks/issues identified by your obstetrical provider   Please remember that birth is unpredictable. Under certain unforeseeable circumstances your provider may advise against giving birth in the tub. These decisions will be made on a case-by-case basis and with the safety of you and your baby as our highest priority.   *Please remember that in order to have a waterbirth, you must test  Negative to COVID-19 upon admission to the hospital.  Updated 01/23/21

## 2021-06-12 ENCOUNTER — Ambulatory Visit: Payer: No Typology Code available for payment source | Admitting: *Deleted

## 2021-06-12 ENCOUNTER — Encounter: Payer: Self-pay | Admitting: *Deleted

## 2021-06-12 ENCOUNTER — Other Ambulatory Visit: Payer: Self-pay | Admitting: Obstetrics and Gynecology

## 2021-06-12 ENCOUNTER — Ambulatory Visit: Payer: No Typology Code available for payment source | Attending: Obstetrics

## 2021-06-12 ENCOUNTER — Other Ambulatory Visit: Payer: Self-pay

## 2021-06-12 VITALS — BP 105/66 | HR 101

## 2021-06-12 DIAGNOSIS — D563 Thalassemia minor: Secondary | ICD-10-CM | POA: Insufficient documentation

## 2021-06-12 DIAGNOSIS — Z148 Genetic carrier of other disease: Secondary | ICD-10-CM

## 2021-06-12 DIAGNOSIS — Z3480 Encounter for supervision of other normal pregnancy, unspecified trimester: Secondary | ICD-10-CM

## 2021-06-12 DIAGNOSIS — O09212 Supervision of pregnancy with history of pre-term labor, second trimester: Secondary | ICD-10-CM | POA: Diagnosis not present

## 2021-06-12 DIAGNOSIS — O99213 Obesity complicating pregnancy, third trimester: Secondary | ICD-10-CM | POA: Diagnosis not present

## 2021-06-12 DIAGNOSIS — E669 Obesity, unspecified: Secondary | ICD-10-CM

## 2021-06-12 DIAGNOSIS — Z6841 Body Mass Index (BMI) 40.0 and over, adult: Secondary | ICD-10-CM

## 2021-06-12 DIAGNOSIS — Z3A32 32 weeks gestation of pregnancy: Secondary | ICD-10-CM | POA: Diagnosis not present

## 2021-06-12 DIAGNOSIS — Z362 Encounter for other antenatal screening follow-up: Secondary | ICD-10-CM | POA: Diagnosis not present

## 2021-06-22 ENCOUNTER — Encounter: Payer: Self-pay | Admitting: Obstetrics and Gynecology

## 2021-06-22 ENCOUNTER — Ambulatory Visit (INDEPENDENT_AMBULATORY_CARE_PROVIDER_SITE_OTHER): Payer: No Typology Code available for payment source | Admitting: Obstetrics and Gynecology

## 2021-06-22 ENCOUNTER — Other Ambulatory Visit: Payer: Self-pay

## 2021-06-22 VITALS — BP 141/75 | HR 112 | Temp 98.0°F | Wt 263.6 lb

## 2021-06-22 DIAGNOSIS — O139 Gestational [pregnancy-induced] hypertension without significant proteinuria, unspecified trimester: Secondary | ICD-10-CM

## 2021-06-22 DIAGNOSIS — D563 Thalassemia minor: Secondary | ICD-10-CM

## 2021-06-22 DIAGNOSIS — Z148 Genetic carrier of other disease: Secondary | ICD-10-CM

## 2021-06-22 DIAGNOSIS — Z8759 Personal history of other complications of pregnancy, childbirth and the puerperium: Secondary | ICD-10-CM

## 2021-06-22 DIAGNOSIS — Z3480 Encounter for supervision of other normal pregnancy, unspecified trimester: Secondary | ICD-10-CM

## 2021-06-22 DIAGNOSIS — O0990 Supervision of high risk pregnancy, unspecified, unspecified trimester: Secondary | ICD-10-CM

## 2021-06-22 HISTORY — DX: Personal history of other complications of pregnancy, childbirth and the puerperium: Z87.59

## 2021-06-22 HISTORY — DX: Gestational (pregnancy-induced) hypertension without significant proteinuria, unspecified trimester: O13.9

## 2021-06-22 NOTE — Progress Notes (Signed)
HIGH-RISK PREGNANCY OFFICE VISIT Patient name: Jenny Carr MRN [id number]  Date of birth: 1986-02-16 Chief Complaint:   Routine Prenatal Visit  History of Present Illness:   Jenny Carr is a 35 y.o. G63P2002 female at [redacted]w[redacted]d with an Estimated Date of Delivery: 08/01/21 being seen today for ongoing management of a high-risk pregnancy complicated by gestational hypertension currently on no meds Today she reports no complaints. Contractions: Not present. Vag. Bleeding: None.  Movement: Present. denies leaking of fluid.  Review of Systems:   Pertinent items are noted in HPI Denies abnormal vaginal discharge w/ itching/odor/irritation, headaches, visual changes, shortness of breath, chest pain, abdominal pain, severe nausea/vomiting, or problems with urination or bowel movements unless otherwise stated above. Pertinent History Reviewed:  Reviewed past medical,surgical, social, obstetrical and family history.  Reviewed problem list, medications and allergies. Physical Assessment:   Vitals:   06/22/21 1050  BP: (!) 141/75  Pulse: (!) 112  Temp: 98 F (36.7 C)  Weight: 263 lb 9.6 oz (119.6 kg)  Body mass index is 49.81 kg/m.           Physical Examination:   General appearance: alert, well appearing, and in no distress, oriented to person, place, and time, and overweight  Mental status: alert, oriented to person, place, and time, normal mood, behavior, speech, dress, motor activity, and thought processes  Skin: warm & dry   Extremities: Edema: None    Cardiovascular: normal heart rate noted  Respiratory: normal respiratory effort, no distress  Abdomen: gravid, soft, non-tender  Pelvic: Cervical exam deferred         Fetal Status: Fetal Heart Rate (bpm): 137 Fundal Height: 38 cm Movement: Present Presentation: Undeterminable  Fetal Surveillance Testing today: none   No results found for this or any previous visit (from the past 24 hour(s)).  Assessment & Plan:  1) High-risk  pregnancy G3P2002 at [redacted]w[redacted]d with an Estimated Date of Delivery: 08/01/21   2) Supervision of high risk pregnancy, antepartum - Advised that with 2 elevated BPs more than 4 hours apart, she is now dx'd with gestation hypertension and is NO longer a candidate for waterbirth - Patient verbalized an understanding of the plan of care and agrees. - Anticipatory guidance for GBS screening at 36 wks. Explained the test is important to be done at this time in pregnancy to ensure adequate treatment at the time of delivery. Explained that a positive result does not mean any harm to her, but can be harmful to the baby. Meaning that if baby is exposed to the bacteria for too long without antibiotics, the bay has the potential to develop pneumonia, septicemia, or spinal meningitis and could end up in the NICU. Also, explained that a cervical exam may be performed at the time of testing to get a baseline cervical check and make sure there is no preterm cervical dilation.   3) Gestational hypertension, antepartum   Meds: No orders of the defined types were placed in this encounter.  Labs/procedures today: none  Treatment Plan:  continue to monitor  Reviewed: Preterm labor symptoms and general obstetric precautions including but not limited to vaginal bleeding, contractions, leaking of fluid and fetal movement were reviewed in detail with the patient.  All questions were answered. Has home bp cuff. Check bp weekly, let us know if >140/90.   Follow-up: Return in about 2 weeks (around 07/06/2021) for Return OB w/GBS.  No orders of the defined types were placed in this encounter.  Raelyn Mora MSN,  CNM 06/22/2021

## 2021-06-28 ENCOUNTER — Ambulatory Visit (INDEPENDENT_AMBULATORY_CARE_PROVIDER_SITE_OTHER): Payer: No Typology Code available for payment source

## 2021-06-28 ENCOUNTER — Other Ambulatory Visit: Payer: No Typology Code available for payment source

## 2021-06-28 ENCOUNTER — Ambulatory Visit: Payer: No Typology Code available for payment source

## 2021-06-28 ENCOUNTER — Other Ambulatory Visit: Payer: Self-pay

## 2021-06-28 VITALS — BP 134/83 | HR 105 | Wt 259.7 lb

## 2021-06-28 DIAGNOSIS — O133 Gestational [pregnancy-induced] hypertension without significant proteinuria, third trimester: Secondary | ICD-10-CM

## 2021-06-28 DIAGNOSIS — D563 Thalassemia minor: Secondary | ICD-10-CM

## 2021-06-28 DIAGNOSIS — Z3A35 35 weeks gestation of pregnancy: Secondary | ICD-10-CM

## 2021-06-28 DIAGNOSIS — O0990 Supervision of high risk pregnancy, unspecified, unspecified trimester: Secondary | ICD-10-CM

## 2021-06-28 DIAGNOSIS — Z148 Genetic carrier of other disease: Secondary | ICD-10-CM

## 2021-06-28 DIAGNOSIS — O132 Gestational [pregnancy-induced] hypertension without significant proteinuria, second trimester: Secondary | ICD-10-CM

## 2021-06-28 NOTE — Progress Notes (Signed)
Patient presents for NST/AFI for gestational hypertension.  AFI  15.99

## 2021-06-30 ENCOUNTER — Ambulatory Visit (INDEPENDENT_AMBULATORY_CARE_PROVIDER_SITE_OTHER): Payer: No Typology Code available for payment source | Admitting: Certified Nurse Midwife

## 2021-06-30 ENCOUNTER — Other Ambulatory Visit: Payer: Self-pay

## 2021-06-30 ENCOUNTER — Other Ambulatory Visit (HOSPITAL_COMMUNITY)
Admission: RE | Admit: 2021-06-30 | Discharge: 2021-06-30 | Disposition: A | Discharge status: Home / Self Care | Payer: No Typology Code available for payment source | Source: Ambulatory Visit | Attending: Certified Nurse Midwife | Admitting: Certified Nurse Midwife

## 2021-06-30 VITALS — BP 134/79 | HR 120 | Temp 98.1°F | Wt 259.4 lb

## 2021-06-30 DIAGNOSIS — Z3A35 35 weeks gestation of pregnancy: Secondary | ICD-10-CM | POA: Diagnosis present

## 2021-06-30 DIAGNOSIS — Z3493 Encounter for supervision of normal pregnancy, unspecified, third trimester: Secondary | ICD-10-CM | POA: Insufficient documentation

## 2021-06-30 DIAGNOSIS — O133 Gestational [pregnancy-induced] hypertension without significant proteinuria, third trimester: Secondary | ICD-10-CM

## 2021-06-30 LAB — OB RESULTS CONSOLE GC/CHLAMYDIA
Chlamydia: NEGATIVE
Gonorrhea: NEGATIVE

## 2021-06-30 NOTE — Progress Notes (Signed)
   PRENATAL VISIT NOTE  Subjective:  Jenny Carr is a 35 y.o. G3P2002 at [redacted]w[redacted]d being seen today for ongoing prenatal care.  She is currently monitored for the following issues for this low-risk pregnancy and has Obesity (BMI 30-39.9); Obesity affecting pregnancy, antepartum; Supervision of high risk pregnancy, antepartum; Alpha thalassemia silent carrier; Genetic carrier; and Gestational hypertension on their problem list.  Patient reports no complaints.  Contractions: Not present. Vag. Bleeding: None.  Movement: Present. Denies leaking of fluid.   The following portions of the patient's history were reviewed and updated as appropriate: allergies, current medications, past family history, past medical history, past social history, past surgical history and problem list.   Objective:   Vitals:   06/30/21 0906  BP: 134/79  Pulse: (!) 120  Temp: 98.1 F (36.7 C)  Weight: 259 lb 6.4 oz (117.7 kg)    Fetal Status: Fetal Heart Rate (bpm): NST Fundal Height: 39 cm Movement: Present    NST reactive  General:  Alert, oriented and cooperative. Patient is in no acute distress.  Skin: Skin is warm and dry. No rash noted.   Cardiovascular: Normal heart rate noted  Respiratory: Normal respiratory effort, no problems with respiration noted  Abdomen: Soft, gravid, appropriate for gestational age.  Pain/Pressure: Present     Pelvic: Cervical exam deferred        Extremities: Normal range of motion.  Edema: None  Mental Status: Normal mood and affect. Normal behavior. Normal judgment and thought content.   Assessment and Plan:  Pregnancy: G3P2002 at [redacted]w[redacted]d 1. Supervision of low-risk pregnancy, third trimester - Doing well, feeling regular and vigorous fetal movement  2. [redacted] weeks gestation of pregnancy - Routine OB care - 36wk testing collected today since she will be induced at 37-38wks depending on her BP - Culture, beta strep (group b only) - Cervicovaginal ancillary only( [facility])  3. Gestational hypertension, third trimester - Explained effects of gHTN at length and discussed alternate birthing plans since she is no longer a candidate for waterbirth.  Preterm labor symptoms and general obstetric precautions including but not limited to vaginal bleeding, contractions, leaking of fluid and fetal movement were reviewed in detail with the patient. Please refer to After Visit Summary for other counseling recommendations.   Return in about 1 week (around 07/07/2021) for IN-PERSON, LOB.  Future Appointments  Date Time Provider Department Center  07/05/2021  8:15 AM [facility] NST [facility]  07/06/2021 10:35 AM Raelyn Mora, CNM [facility] None  07/11/2021  8:30 AM [facility] NURSE [facility]  07/11/2021  8:45 AM [facility] US6 [facility]  07/13/2021 10:35 AM Raelyn Mora, CNM [facility] None  07/19/2021  8:15 AM [facility] NST [facility]    Bernerd Limbo, CNM

## 2021-06-30 NOTE — Progress Notes (Signed)
Patient was assessed and managed by nursing staff during this encounter. I have reviewed the chart and agree with the documentation and plan. I have also made any necessary editorial changes.  Victory Strollo A Cristian Grieves, MD 06/30/2021 12:53 PM   

## 2021-07-03 LAB — CERVICOVAGINAL ANCILLARY ONLY
Chlamydia: NEGATIVE
Comment: NEGATIVE
Comment: NEGATIVE
Comment: NORMAL
Neisseria Gonorrhea: NEGATIVE
Trichomonas: NEGATIVE

## 2021-07-04 LAB — CULTURE, BETA STREP (GROUP B ONLY): Strep Gp B Culture: NEGATIVE

## 2021-07-05 ENCOUNTER — Other Ambulatory Visit: Payer: Self-pay

## 2021-07-05 ENCOUNTER — Ambulatory Visit (INDEPENDENT_AMBULATORY_CARE_PROVIDER_SITE_OTHER): Payer: No Typology Code available for payment source

## 2021-07-05 ENCOUNTER — Ambulatory Visit: Payer: No Typology Code available for payment source | Admitting: *Deleted

## 2021-07-05 VITALS — BP 126/72 | HR 99 | Wt 263.6 lb

## 2021-07-05 DIAGNOSIS — O133 Gestational [pregnancy-induced] hypertension without significant proteinuria, third trimester: Secondary | ICD-10-CM

## 2021-07-05 DIAGNOSIS — O99210 Obesity complicating pregnancy, unspecified trimester: Secondary | ICD-10-CM | POA: Diagnosis not present

## 2021-07-05 DIAGNOSIS — Z3A36 36 weeks gestation of pregnancy: Secondary | ICD-10-CM

## 2021-07-05 NOTE — Progress Notes (Signed)

## 2021-07-06 ENCOUNTER — Other Ambulatory Visit (HOSPITAL_COMMUNITY)
Admission: RE | Admit: 2021-07-06 | Discharge: 2021-07-06 | Disposition: A | Discharge status: Home / Self Care | Payer: No Typology Code available for payment source | Source: Ambulatory Visit | Attending: Obstetrics and Gynecology | Admitting: Obstetrics and Gynecology

## 2021-07-06 ENCOUNTER — Ambulatory Visit (INDEPENDENT_AMBULATORY_CARE_PROVIDER_SITE_OTHER): Payer: No Typology Code available for payment source | Admitting: Obstetrics and Gynecology

## 2021-07-06 ENCOUNTER — Encounter: Payer: Self-pay | Admitting: Obstetrics and Gynecology

## 2021-07-06 VITALS — BP 141/82 | HR 97 | Temp 98.0°F | Wt 263.2 lb

## 2021-07-06 DIAGNOSIS — O0990 Supervision of high risk pregnancy, unspecified, unspecified trimester: Secondary | ICD-10-CM | POA: Diagnosis not present

## 2021-07-06 DIAGNOSIS — Z3A36 36 weeks gestation of pregnancy: Secondary | ICD-10-CM | POA: Diagnosis present

## 2021-07-06 DIAGNOSIS — O99210 Obesity complicating pregnancy, unspecified trimester: Secondary | ICD-10-CM | POA: Diagnosis present

## 2021-07-06 DIAGNOSIS — O133 Gestational [pregnancy-induced] hypertension without significant proteinuria, third trimester: Secondary | ICD-10-CM

## 2021-07-06 NOTE — Patient Instructions (Signed)
Labor Induction ?Labor induction is when steps are taken to cause a pregnant woman to begin the labor process. Most women go into labor on their own between 37 weeks and 42 weeks of pregnancy. When this does not happen, or when there is a medical need for labor to begin, steps may be taken to induce, or bring on, labor. ?Labor induction causes a pregnant woman's uterus to contract. It also causes the cervix to soften (ripen), open (dilate), and thin out. Usually, labor is not induced before 39 weeks of pregnancy unless there is a medical reason to do so. ?When is labor induction considered? ?Labor induction may be right for you if: ?Your pregnancy lasts longer than 41 to 42 weeks. ?Your placenta is separating from your uterus (placental abruption). ?You have a rupture of membranes and your labor does not begin. ?You have health problems, like diabetes or high blood pressure (preeclampsia) during your pregnancy. ?Your baby has stopped growing or does not have enough amniotic fluid. ?Before labor induction begins, your health care provider will consider the following factors: ?Your medical condition and the baby's condition. ?How many weeks you have been pregnant. ?How mature the baby's lungs are. ?The condition of your cervix. ?The position of the baby. ?The size of your birth canal. ?Tell a health care provider about: ?Any allergies you have. ?All medicines you are taking, including vitamins, herbs, eye drops, creams, and over-the-counter medicines. ?Any problems you or your family members have had with anesthetic medicines. ?Any surgeries you have had. ?Any blood disorders you have. ?Any medical conditions you have. ?What are the risks? ?Generally, this is a safe procedure. However, problems may occur, including: ?Failed induction. ?Changes in fetal heart rate, such as being too high, too low, or irregular (erratic). ?Infection in the mother or the baby. ?Increased risk of having a cesarean delivery. ?Breaking off  (abruption) of the placenta from the uterus. This is rare. ?Rupture of the uterus. This is very rare. ?Your baby could fail to get enough blood flow or oxygen. This can be life-threatening. ?When induction is needed for medical reasons, the benefits generally outweigh the risks. ?What happens during the procedure? ?During the procedure, your health care provider will use one of these methods to induce labor: ?Stripping the membranes. In this method, the amniotic sac tissue is gently separated from the cervix. This causes the following to happen: ?Your cervix stretches, which in turn causes the release of prostaglandins. ?Prostaglandins induce labor and cause the uterus to contract. ?This procedure is often done in an office visit. You will be sent home to wait for contractions to begin. ?Prostaglandin medicine. This medicine starts contractions and causes the cervix to dilate and ripen. This can be taken by mouth (orally) or by being inserted into the vagina (suppository). ?Inserting a small, thin tube (catheter) with a balloon into the vagina and then expanding the balloon with water to dilate the cervix. ?Breaking the water. In this method, a small instrument is used to make a small hole in the amniotic sac. This eventually causes the amniotic sac to break. Contractions should begin within a few hours. ?Medicine to trigger or strengthen contractions. This medicine is given through an IV that is inserted into a vein in your arm. ?This procedure may vary among health care providers and hospitals. ?Where to find more information ?March of Dimes: www.marchofdimes.org ?The American College of Obstetricians and Gynecologists: www.acog.org ?Summary ?Labor induction causes a pregnant woman's uterus to contract. It also causes the cervix   to soften (ripen), open (dilate), and thin out. ?Labor is usually not induced before 39 weeks of pregnancy unless there is a medical reason to do so. ?When induction is needed for medical  reasons, the benefits generally outweigh the risks. ?Talk with your health care provider about which methods of labor induction are right for you. ?This information is not intended to replace advice given to you by your health care provider. Make sure you discuss any questions you have with your health care provider. ?Document Revised: 07/14/2020 Document Reviewed: 07/14/2020 ?Elsevier Patient Education ? 2022 Elsevier Inc. ? ?

## 2021-07-06 NOTE — Progress Notes (Signed)
  HIGH-RISK PREGNANCY OFFICE VISIT Patient name: Jenny Carr MRN [id number]  Date of birth: 1985-11-27 Chief Complaint:   Routine Prenatal Visit  History of Present Illness:   Jenny Carr is a 35 y.o. G66P2002 female at [redacted]w[redacted]d with an Estimated Date of Delivery: 08/01/21 being seen today for ongoing management of a high-risk pregnancy complicated by gestational hypertension currently on no meds. Today she reports no complaints. Contractions: Not present. Vag. Bleeding: None.  Movement: Present. Denies leaking of fluid.  Review of Systems:   Pertinent items are noted in HPI Denies abnormal vaginal discharge w/ itching/odor/irritation, headaches, visual changes, shortness of breath, chest pain, abdominal pain, severe nausea/vomiting, or problems with urination or bowel movements unless otherwise stated above. Pertinent History Reviewed:  Reviewed past medical,surgical, social, obstetrical and family history.  Reviewed problem list, medications and allergies. Physical Assessment:   Vitals:   07/06/21 1046  BP: (!) 141/82  Pulse: 97  Temp: 98 F (36.7 C)  Weight: 263 lb 3.2 oz (119.4 kg)  Body mass index is 49.73 kg/m.           Physical Examination:   General appearance: alert, well appearing, and in no distress, oriented to person, place, and time, and overweight  Mental status: alert, oriented to person, place, and time, normal mood, behavior, speech, dress, motor activity, and thought processes  Skin: warm & dry   Extremities: Edema: None    Cardiovascular: normal heart rate noted  Respiratory: normal respiratory effort, no distress  Abdomen: gravid, soft, non-tender  Pelvic: Cervical exam performed         Fetal Status: Fetal Heart Rate (bpm): 150 Fundal Height: 41 cm Movement: Present    Fetal Surveillance Testing today: none   No results found for this or any previous visit (from the past 24 hour(s)).  Assessment & Plan:  1) High-risk pregnancy G3P2002 at [redacted]w[redacted]d  with an Estimated Date of Delivery: 08/01/21   2) Supervision of high risk pregnancy, antepartum  - Culture, beta strep (group b only),  - Cervicovaginal ancillary only( [place])  3) Obesity affecting pregnancy, antepartum  - Taking bASA daily  4) [redacted] weeks gestation of pregnancy  - GBS results pending  5) Gestational hypertension, third trimester  - IOL at 37 weeks -- scheduled for 07/16/21  Orders placed in Epic  Meds: No orders of the defined types were placed in this encounter.   Labs/procedures today: GBS, GC/CT and cervical exam  Treatment Plan:  IOL @ 37 wks  Reviewed: Preterm labor symptoms and general obstetric precautions including but not limited to vaginal bleeding, contractions, leaking of fluid and fetal movement were reviewed in detail with the patient.  All questions were answered. Has home bp cuff. Check bp weekly, let us know if >140/90.   Follow-up: No follow-ups on file.  Orders Placed This Encounter  Procedures   Culture, beta strep (group b only)   Raelyn Mora MSN, CNM 07/06/2021 11:11 AM

## 2021-07-07 LAB — CERVICOVAGINAL ANCILLARY ONLY
Bacterial Vaginitis (gardnerella): NEGATIVE
Candida Glabrata: NEGATIVE
Candida Vaginitis: NEGATIVE
Chlamydia: NEGATIVE
Comment: NEGATIVE
Comment: NEGATIVE
Comment: NEGATIVE
Comment: NEGATIVE
Comment: NEGATIVE
Comment: NORMAL
Neisseria Gonorrhea: NEGATIVE
Trichomonas: NEGATIVE

## 2021-07-08 ENCOUNTER — Other Ambulatory Visit: Payer: Self-pay | Admitting: Advanced Practice Midwife

## 2021-07-10 ENCOUNTER — Ambulatory Visit: Payer: No Typology Code available for payment source

## 2021-07-10 LAB — CULTURE, BETA STREP (GROUP B ONLY): Strep Gp B Culture: NEGATIVE

## 2021-07-11 ENCOUNTER — Ambulatory Visit: Payer: No Typology Code available for payment source | Admitting: *Deleted

## 2021-07-11 ENCOUNTER — Ambulatory Visit: Payer: No Typology Code available for payment source | Attending: Obstetrics and Gynecology

## 2021-07-11 ENCOUNTER — Other Ambulatory Visit: Payer: Self-pay

## 2021-07-11 ENCOUNTER — Encounter: Payer: Self-pay | Admitting: *Deleted

## 2021-07-11 ENCOUNTER — Telehealth (HOSPITAL_COMMUNITY): Payer: Self-pay | Admitting: *Deleted

## 2021-07-11 VITALS — BP 117/64 | HR 82

## 2021-07-11 DIAGNOSIS — O0990 Supervision of high risk pregnancy, unspecified, unspecified trimester: Secondary | ICD-10-CM | POA: Diagnosis not present

## 2021-07-11 DIAGNOSIS — D563 Thalassemia minor: Secondary | ICD-10-CM | POA: Diagnosis not present

## 2021-07-11 DIAGNOSIS — O99213 Obesity complicating pregnancy, third trimester: Secondary | ICD-10-CM

## 2021-07-11 DIAGNOSIS — Z3A37 37 weeks gestation of pregnancy: Secondary | ICD-10-CM | POA: Diagnosis not present

## 2021-07-11 DIAGNOSIS — E669 Obesity, unspecified: Secondary | ICD-10-CM

## 2021-07-11 DIAGNOSIS — O133 Gestational [pregnancy-induced] hypertension without significant proteinuria, third trimester: Secondary | ICD-10-CM | POA: Diagnosis not present

## 2021-07-11 DIAGNOSIS — Z148 Genetic carrier of other disease: Secondary | ICD-10-CM | POA: Diagnosis not present

## 2021-07-11 NOTE — Telephone Encounter (Signed)
Preadmission screen  

## 2021-07-12 ENCOUNTER — Telehealth (HOSPITAL_COMMUNITY): Payer: Self-pay | Admitting: *Deleted

## 2021-07-12 NOTE — Telephone Encounter (Signed)
Preadmission screen  

## 2021-07-13 ENCOUNTER — Encounter (HOSPITAL_COMMUNITY): Payer: Self-pay

## 2021-07-13 ENCOUNTER — Ambulatory Visit: Payer: No Typology Code available for payment source | Admitting: Obstetrics and Gynecology

## 2021-07-13 ENCOUNTER — Encounter (HOSPITAL_COMMUNITY): Payer: Self-pay | Admitting: *Deleted

## 2021-07-13 ENCOUNTER — Telehealth (HOSPITAL_COMMUNITY): Payer: Self-pay | Admitting: *Deleted

## 2021-07-13 ENCOUNTER — Other Ambulatory Visit: Payer: Self-pay

## 2021-07-13 VITALS — BP 120/69 | HR 109 | Wt 258.4 lb

## 2021-07-13 DIAGNOSIS — O133 Gestational [pregnancy-induced] hypertension without significant proteinuria, third trimester: Secondary | ICD-10-CM

## 2021-07-13 DIAGNOSIS — Z3A37 37 weeks gestation of pregnancy: Secondary | ICD-10-CM

## 2021-07-13 DIAGNOSIS — O99210 Obesity complicating pregnancy, unspecified trimester: Secondary | ICD-10-CM

## 2021-07-13 DIAGNOSIS — O0990 Supervision of high risk pregnancy, unspecified, unspecified trimester: Secondary | ICD-10-CM

## 2021-07-13 NOTE — Progress Notes (Signed)
  HIGH-RISK PREGNANCY OFFICE VISIT Patient name: Jenny Carr MRN [id number]  Date of birth: 12-11-1985 Chief Complaint:   Routine Prenatal Visit  History of Present Illness:   Jenny Carr is a 35 y.o. G32P2002 female at [redacted]w[redacted]d with an Estimated Date of Delivery: 08/01/21 being seen today for ongoing management of a high-risk pregnancy complicated by gestational hypertension currently on no HTN medications. Today she reports occasional contractions. Contractions: Not present. Vag. Bleeding: None.  Movement: Present. denies leaking of fluid.  Review of Systems:   Pertinent items are noted in HPI Denies abnormal vaginal discharge w/ itching/odor/irritation, headaches, visual changes, shortness of breath, chest pain, abdominal pain, severe nausea/vomiting, or problems with urination or bowel movements unless otherwise stated above. Pertinent History Reviewed:  Reviewed past medical,surgical, social, obstetrical and family history.  Reviewed problem list, medications and allergies. Physical Assessment:   Vitals:   07/13/21 1050  BP: 120/69  Pulse: (!) 109  Weight: 258 lb 6.4 oz (117.2 kg)  Body mass index is 48.82 kg/m.           Physical Examination:   General appearance: alert, well appearing, and in no distress, oriented to person, place, and time, and overweight  Mental status: alert, oriented to person, place, and time, normal mood, behavior, speech, dress, motor activity, and thought processes  Skin: warm & dry   Extremities: Edema: None    Cardiovascular: normal heart rate noted  Respiratory: normal respiratory effort, no distress  Abdomen: gravid, soft, non-tender  Pelvic: Cervical exam performed  Dilation: 1 Effacement (%): 50 Station: Ballotable, -3  Fetal Status: Fetal Heart Rate (bpm): 153 Fundal Height: 48 cm Movement: Present Presentation: Vertex  Fetal Surveillance Testing today: none   No results found for this or any previous visit (from the past 24 hour(s)).   Assessment & Plan:  1) High-risk pregnancy G3P2002 at [redacted]w[redacted]d with an Estimated Date of Delivery: 08/01/21   2) Supervision of high risk pregnancy, antepartum - Advised to go to [facility]U on Saturday 07/15/2021 around 1700 for FB insertion - MAU providers notified  3) Gestational hypertension, third trimester - IOL on 07/16/21  4) Obesity affecting pregnancy, antepartum - Taking bASA  5) [redacted] weeks gestation of pregnancy   Meds: No orders of the defined types were placed in this encounter.   Labs/procedures today: cervical exam  Treatment Plan:  none  Reviewed: Term labor symptoms and general obstetric precautions including but not limited to vaginal bleeding, contractions, leaking of fluid and fetal movement were reviewed in detail with the patient.  All questions were answered. Has home bp cuff. Check bp weekly, let us know if >140/90.   Follow-up: No follow-ups on file.  No orders of the defined types were placed in this encounter.  Raelyn Mora MSN, CNM 07/13/2021

## 2021-07-13 NOTE — Telephone Encounter (Signed)
Preadmission screen  

## 2021-07-13 NOTE — Patient Instructions (Addendum)
Balloon Catheter Placement for Cervical Ripening Balloon catheter placement for cervical ripening is a procedure to help your cervix start to open (dilate) for birth. During this procedure, a thin tube (catheter) is placed through your cervix. A tiny balloon attached to the catheter is inflated with water and helps your cervix start to dilate. This procedure is done to prepare your body to induce, or bring on, labor. Cervical ripening with a balloon catheter can make labor induction shorter and easier. You may have this procedure if: Your cervix is not ready for labor. Your health care provider has planned labor induction. You are not having twins or multiples. Your baby is in the head-down position. You do not have any other pregnancy complications that require you to be monitored in the hospital after balloon catheter placement. If your health care provider has recommended labor induction to stimulate a vaginal birth, this procedure may be started the day before induction. You may go home with the balloon in place and return to the hospital to start induction in 12-24 hours. Or you may have this procedure and stay in the hospital so that your progress can be monitored. Tell a health care provider about: Any allergies you have. All medicines you are taking, including vitamins, herbs, eye drops, creams, and over-the-counter medicines. Any blood disorders you have. Any surgeries you have had. Any medical conditions you have. What are the risks? Generally, this is a safe procedure. However, problems may occur, including: Infection. Bleeding. Cramping or pain. Difficulty passing urine. The baby moving from the head-down position to a position with the feet or buttocks down (breech position). What happens before the procedure? Your health care provider may check your baby's heartbeat (fetal monitoring) before the procedure. You may be asked to empty your bladder. What happens during the  procedure?  You will be positioned on the exam table as if you were having a pelvic exam or Pap test. Your health care provider may insert a medical instrument into your vagina (speculum) to see your cervix. Your cervix may be cleaned with a germ-killing solution. The catheter will be inserted through the opening of your cervix. A balloon on the end of the catheter will be inflated with germ-free (sterile) water. Some catheters have two balloons, one on each side of the cervix. Depending on the type of balloon catheter, the end of the catheter may be left free outside your cervix or taped to your leg. The procedure may vary among health care providers and hospitals. What can I expect after the procedure? After the procedure, it is common to have: A feeling of pressure inside your vagina. Light vaginal bleeding (spotting). You may have fetal monitoring before going home. You may be sent home with the catheter in place and asked to return to start your labor induction in about 12-24 hours. Follow these instructions at home: Take over-the-counter and prescription medicines only as told by your health care provider. Do not leave home until you return to the hospital for labor induction. Return to your normal activities at home as told by your health care provider. Ask your health care provider what activities are safe for you. Do not take baths, swim, or use a hot tub unless your health care provider approves. You may shower at home. As your cervix dilates, your catheter and balloon may fall out before you return for labor induction. Ask your health care provider what you should do if this happens. You will need to return to start  induction as told by your health care provider. Keep all follow-up visits. This is important. Contact your health care provider if: You have chills or a fever. You have constant pain or cramps that are not contractions. You have trouble passing urine. Your water  breaks. You have vaginal bleeding that is heavier than spotting. You have contractions that start to last longer and come closer together (about every 5 minutes). The balloon catheter falls out before you return to start your induction. Summary Cervical ripening with a balloon catheter helps your cervix start to open (dilate) for birth. Pressure from the balloon will cause ripening of your cervix. You may go home with the balloon in place and return to start induction in 12-24 hours, or you may stay in the hospital while the balloon is in place. Follow your health care provider's instructions for when to call for help or return to the hospital. This information is not intended to replace advice given to you by your health care provider. Make sure you discuss any questions you have with your health care provider. Document Revised: 07/14/2020 Document Reviewed: 07/14/2020 Elsevier Patient Education  2022 [employer].  Labor Induction Labor induction is when steps are taken to cause a pregnant woman to begin the labor process. Most women go into labor on their own between 37 weeks and 42 weeks of pregnancy. When this does not happen, or when there is a medical need for labor to begin, steps may be taken to induce, or bring on, labor. Labor induction causes a pregnant woman's uterus to contract. It also causes the cervix to soften (ripen), open (dilate), and thin out. Usually, labor is not induced before 39 weeks of pregnancy unless there is a medical reason to do so. When is labor induction considered? Labor induction may be right for you if: Your pregnancy lasts longer than 41 to 42 weeks. Your placenta is separating from your uterus (placental abruption). You have a rupture of membranes and your labor does not begin. You have health problems, like diabetes or high blood pressure (preeclampsia) during your pregnancy. Your baby has stopped growing or does not have enough amniotic fluid. Before  labor induction begins, your health care provider will consider the following factors: Your medical condition and the baby's condition. How many weeks you have been pregnant. How mature the baby's lungs are. The condition of your cervix. The position of the baby. The size of your birth canal. Tell a health care provider about: Any allergies you have. All medicines you are taking, including vitamins, herbs, eye drops, creams, and over-the-counter medicines. Any problems you or your family members have had with anesthetic medicines. Any surgeries you have had. Any blood disorders you have. Any medical conditions you have. What are the risks? Generally, this is a safe procedure. However, problems may occur, including: Failed induction. Changes in fetal heart rate, such as being too high, too low, or irregular (erratic). Infection in the mother or the baby. Increased risk of having a cesarean delivery. Breaking off (abruption) of the placenta from the uterus. This is rare. Rupture of the uterus. This is very rare. Your baby could fail to get enough blood flow or oxygen. This can be life-threatening. When induction is needed for medical reasons, the benefits generally outweigh the risks. What happens during the procedure? During the procedure, your health care provider will use one of these methods to induce labor: Stripping the membranes. In this method, the amniotic sac tissue is gently separated from  the cervix. This causes the following to happen: Your cervix stretches, which in turn causes the release of prostaglandins. Prostaglandins induce labor and cause the uterus to contract. This procedure is often done in an office visit. You will be sent home to wait for contractions to begin. Prostaglandin medicine. This medicine starts contractions and causes the cervix to dilate and ripen. This can be taken by mouth (orally) or by being inserted into the vagina (suppository). Inserting a  small, thin tube (catheter) with a balloon into the vagina and then expanding the balloon with water to dilate the cervix. Breaking the water. In this method, a small instrument is used to make a small hole in the amniotic sac. This eventually causes the amniotic sac to break. Contractions should begin within a few hours. Medicine to trigger or strengthen contractions. This medicine is given through an IV that is inserted into a vein in your arm. This procedure may vary among health care providers and hospitals. Where to find more information [employer]: www.marchofdimes.org [employer]: www.acog.org Summary Labor induction causes a pregnant woman's uterus to contract. It also causes the cervix to soften (ripen), open (dilate), and thin out. Labor is usually not induced before 39 weeks of pregnancy unless there is a medical reason to do so. When induction is needed for medical reasons, the benefits generally outweigh the risks. Talk with your health care provider about which methods of labor induction are right for you. This information is not intended to replace advice given to you by your health care provider. Make sure you discuss any questions you have with your health care provider. Document Revised: 07/14/2020 Document Reviewed: 07/14/2020 Elsevier Patient Education  2022 [employer].

## 2021-07-13 NOTE — Progress Notes (Signed)
Patient is doing ok.

## 2021-07-15 ENCOUNTER — Encounter (HOSPITAL_COMMUNITY): Payer: Self-pay | Admitting: Obstetrics and Gynecology

## 2021-07-15 ENCOUNTER — Inpatient Hospital Stay (EMERGENCY_DEPARTMENT_HOSPITAL)
Admission: AD | Admit: 2021-07-15 | Discharge: 2021-07-15 | Disposition: A | Discharge status: Home / Self Care | Payer: No Typology Code available for payment source | Source: Home / Self Care | Attending: Obstetrics and Gynecology | Admitting: Obstetrics and Gynecology

## 2021-07-15 DIAGNOSIS — O134 Gestational [pregnancy-induced] hypertension without significant proteinuria, complicating childbirth: Secondary | ICD-10-CM | POA: Diagnosis not present

## 2021-07-15 DIAGNOSIS — O471 False labor at or after 37 completed weeks of gestation: Secondary | ICD-10-CM | POA: Insufficient documentation

## 2021-07-15 DIAGNOSIS — Z3A37 37 weeks gestation of pregnancy: Secondary | ICD-10-CM

## 2021-07-15 DIAGNOSIS — Z3490 Encounter for supervision of normal pregnancy, unspecified, unspecified trimester: Secondary | ICD-10-CM

## 2021-07-15 DIAGNOSIS — O133 Gestational [pregnancy-induced] hypertension without significant proteinuria, third trimester: Secondary | ICD-10-CM

## 2021-07-15 NOTE — MAU Provider Note (Signed)
HPI Jenny Carr is a 35 y.o. female  G3P2002 @ 37.4 wks here for outpatient foley placement for cervical ripening. She is scheduled for IOL d/t gHTN per MFM recommendations. Pt reports occasional contractions since last night. She denies VB or LOF. She reports good (+) fetal movement. All other systems negative.    No LMP recorded (lmp unknown). Patient is pregnant.  OB History  Gravida Para Term Preterm AB Living  3 2 2     2   SAB IAB Ectopic Multiple Live Births        0 2    # Outcome Date GA Lbr Len/2nd Weight Sex Delivery Anes PTL Lv  3 Current           2 Term 09/18/15 [redacted]w[redacted]d 04:47 / 00:27 3751 g F Vag-Spont EPI  LIV  1 Term 04/30/11 [redacted]w[redacted]d 14:32 / 00:56 2835 g M Vag-Spont EPI  LIV    Past Medical History:  Diagnosis Date   Pregnancy induced hypertension    UTI (urinary tract infection) during pregnancy 12/23/2020   12/23/20>TOC    Family History  Problem Relation Age of Onset   Cancer Father     Social History   Socioeconomic History   Marital status: Single    Spouse name: Not on file   Number of children: 2   Years of education: Not on file   Highest education level: Bachelor's degree (e.g., BA, AB, BS)  Occupational History   Not on file  Tobacco Use   Smoking status: Never   Smokeless tobacco: Never  Vaping Use   Vaping Use: Never used  Substance and Sexual Activity   Alcohol use: Not Currently    Comment: social   Drug use: Never   Sexual activity: Yes    Birth control/protection: None    Comment: Nexplanon removed 06/2020  Other Topics Concern   Not on file  Social History Narrative   Not on file   Social Determinants of Health   Financial Resource Strain: Low Risk    Difficulty of Paying Living Expenses: Not hard at all  Food Insecurity: No Food Insecurity   Worried About 07/2020 in the Last Year: Never true   Ran Out of Food in the Last Year: Never true  Transportation Needs: No Transportation Needs   Lack of Transportation  (Medical): No   Lack of Transportation (Non-Medical): No  Physical Activity: Inactive   Days of Exercise per Week: 0 days   Minutes of Exercise per Session: 0 min  Stress: No Stress Concern Present   Feeling of Stress : Not at all  Social Connections: Not on file    No Known Allergies  No current facility-administered medications on file prior to encounter.   Current Outpatient Medications on File Prior to Encounter  Medication Sig Dispense Refill   aspirin 81 MG chewable tablet Chew 1 tablet (81 mg total) by mouth daily. 30 tablet 6   Prenatal 27-1 MG TABS Take 1 tablet by mouth daily. 30 tablet 12    Review of Systems  Constitutional: Negative.   HENT: Negative.    Eyes: Negative.   Respiratory: Negative.    Cardiovascular: Negative.   Gastrointestinal: Negative.   Endocrine: Negative.   Genitourinary:  Positive for pelvic pain ("some cramping").  Musculoskeletal: Negative.   Skin: Negative.   Allergic/Immunologic: Negative.   Neurological: Negative.   Hematological: Negative.   Psychiatric/Behavioral: Negative.     Physical Exam   Vitals:   07/15/21  1758  BP: 137/88  Pulse: 86  Resp: 18  Temp: 98.2 F (36.8 C)  TempSrc: Oral  SpO2: 100%    Physical Exam Vitals and nursing note reviewed. Exam conducted with a chaperone present.  Constitutional:      Appearance: Normal appearance. She is obese.  Cardiovascular:     Rate and Rhythm: Normal rate.  Pulmonary:     Effort: Pulmonary effort is normal.  Genitourinary:    General: Normal vulva.     Comments: Speculum used to visualize cervix  Cervix visually 1 cm/thick  Ring forcep used to thread foley through cervical os without difficulty  balloon instilled with 60 mL of sterile water  patient tolerated well Skin:    General: Skin is warm and dry.  Neurological:     Mental Status: She is alert and oriented to person, place, and time.  Psychiatric:        Mood and Affect: Mood normal.        Behavior:  Behavior normal.        Thought Content: Thought content normal.        Judgment: Judgment normal.    MAU Course   Procedure: Patient informed of R/B/A of procedure. NST was performed and was reactive prior to procedure. Procedure done to begin ripening of the cervix prior to admission for induction of labor. Appropriate time out taken. The patient was placed in the lithotomy position and the cervix brought into view with sterile speculum. A ring forcep was used to guide the 63F foley balloon through the internal os of the cervix. Foley Balloon filled with 60cc of sterile water. Plug inserted into end of the foley. Foley placed on tension and taped to medical thigh. NST:  Baseline: 140 bpm, Variability: Good {> 6 bpm), Accelerations: Reactive, and Decelerations: Absent there were no signs of tachysystole or hypertonus. All equipment was removed and accounted for. The patient tolerated the procedure well.   Assessment and Plan:   S/p Outpatient placement of foley balloon catheter for cervical ripening. Induction of labor scheduled for tomorrow at 0630 am. Reassuring FHR tracing with no concerns at present. Warning signs given to patient to include return to MAU for heavy vaginal bleeding, rupture of membranes, painful uterine contractions q 5 mins or less, severe abdominal discomfort, decreased fetal movement.   Raelyn Mora, CNM 07/15/2021 6:47 PM

## 2021-07-15 NOTE — Discharge Instructions (Signed)
Return for heavy vaginal bleeding that causes you to soak in an hour, decreased or no fetal movement, leaking or gushing of fluid, or contractions every 5 minutes lasting 1 minute for at least an hour.

## 2021-07-15 NOTE — MAU Note (Signed)
Pt reports to mau for foley bulb insertion.  Denies LOF, ctx or vag bleeding. +FM

## 2021-07-15 NOTE — Progress Notes (Signed)
Bedside foley bulb placement and secured on left thigh. Bedside procedure performed by R. Dawson,CNM. Patient educated and given instruction. Understanding was verbalized and patient agreed to plan of care.  Patient to remain on monitor post bulb placement for a 20 minute tracing.

## 2021-07-16 ENCOUNTER — Other Ambulatory Visit: Payer: Self-pay

## 2021-07-16 ENCOUNTER — Inpatient Hospital Stay (HOSPITAL_COMMUNITY)
Admission: AD | Admit: 2021-07-16 | Discharge: 2021-07-18 | DRG: 807 | Disposition: A | Discharge status: Home / Self Care | Payer: No Typology Code available for payment source | Attending: Obstetrics and Gynecology | Admitting: Obstetrics and Gynecology

## 2021-07-16 ENCOUNTER — Encounter (HOSPITAL_COMMUNITY): Payer: Self-pay | Admitting: Obstetrics and Gynecology

## 2021-07-16 ENCOUNTER — Inpatient Hospital Stay (HOSPITAL_COMMUNITY): Payer: No Typology Code available for payment source | Admitting: Anesthesiology

## 2021-07-16 ENCOUNTER — Inpatient Hospital Stay (HOSPITAL_COMMUNITY): Payer: No Typology Code available for payment source

## 2021-07-16 DIAGNOSIS — Z3A37 37 weeks gestation of pregnancy: Secondary | ICD-10-CM | POA: Diagnosis not present

## 2021-07-16 DIAGNOSIS — D563 Thalassemia minor: Secondary | ICD-10-CM | POA: Diagnosis present

## 2021-07-16 DIAGNOSIS — O133 Gestational [pregnancy-induced] hypertension without significant proteinuria, third trimester: Secondary | ICD-10-CM | POA: Diagnosis not present

## 2021-07-16 DIAGNOSIS — O99214 Obesity complicating childbirth: Secondary | ICD-10-CM | POA: Diagnosis present

## 2021-07-16 DIAGNOSIS — Z148 Genetic carrier of other disease: Secondary | ICD-10-CM

## 2021-07-16 DIAGNOSIS — O134 Gestational [pregnancy-induced] hypertension without significant proteinuria, complicating childbirth: Principal | ICD-10-CM | POA: Diagnosis present

## 2021-07-16 DIAGNOSIS — O99210 Obesity complicating pregnancy, unspecified trimester: Secondary | ICD-10-CM | POA: Diagnosis present

## 2021-07-16 DIAGNOSIS — O139 Gestational [pregnancy-induced] hypertension without significant proteinuria, unspecified trimester: Secondary | ICD-10-CM | POA: Diagnosis present

## 2021-07-16 DIAGNOSIS — O0990 Supervision of high risk pregnancy, unspecified, unspecified trimester: Secondary | ICD-10-CM

## 2021-07-16 LAB — TYPE AND SCREEN
ABO/RH(D): A POS
Antibody Screen: NEGATIVE

## 2021-07-16 LAB — CBC WITH DIFFERENTIAL/PLATELET
Abs Immature Granulocytes: 0.04 10*3/uL (ref 0.00–0.07)
Basophils Absolute: 0 10*3/uL (ref 0.0–0.1)
Basophils Relative: 0 %
Eosinophils Absolute: 0.1 10*3/uL (ref 0.0–0.5)
Eosinophils Relative: 2 %
HCT: 34.9 % — ABNORMAL LOW (ref 36.0–46.0)
Hemoglobin: 11.2 g/dL — ABNORMAL LOW (ref 12.0–15.0)
Immature Granulocytes: 1 %
Lymphocytes Relative: 23 %
Lymphs Abs: 1.7 10*3/uL (ref 0.7–4.0)
MCH: 24.1 pg — ABNORMAL LOW (ref 26.0–34.0)
MCHC: 32.1 g/dL (ref 30.0–36.0)
MCV: 75.1 fL — ABNORMAL LOW (ref 80.0–100.0)
Monocytes Absolute: 0.6 10*3/uL (ref 0.1–1.0)
Monocytes Relative: 8 %
Neutro Abs: 4.9 10*3/uL (ref 1.7–7.7)
Neutrophils Relative %: 66 %
Platelets: 259 10*3/uL (ref 150–400)
RBC: 4.65 MIL/uL (ref 3.87–5.11)
RDW: 14.8 % (ref 11.5–15.5)
WBC: 7.4 10*3/uL (ref 4.0–10.5)
nRBC: 0 % (ref 0.0–0.2)

## 2021-07-16 LAB — COMPREHENSIVE METABOLIC PANEL
ALT: 12 U/L (ref 0–44)
AST: 16 U/L (ref 15–41)
Albumin: 3.2 g/dL — ABNORMAL LOW (ref 3.5–5.0)
Alkaline Phosphatase: 119 U/L (ref 38–126)
Anion gap: 9 (ref 5–15)
BUN: 5 mg/dL — ABNORMAL LOW (ref 6–20)
CO2: 22 mmol/L (ref 22–32)
Calcium: 9.6 mg/dL (ref 8.9–10.3)
Chloride: 103 mmol/L (ref 98–111)
Creatinine, Ser: 0.62 mg/dL (ref 0.44–1.00)
GFR, Estimated: >60 mL/min (ref >60)
Glucose, Bld: 98 mg/dL (ref 70–99)
Potassium: 3.5 mmol/L (ref 3.5–5.1)
Sodium: 134 mmol/L — ABNORMAL LOW (ref 135–145)
Total Bilirubin: 0.8 mg/dL (ref 0.3–1.2)
Total Protein: 6.4 g/dL — ABNORMAL LOW (ref 6.5–8.1)

## 2021-07-16 LAB — PROTEIN / CREATININE RATIO, URINE
Creatinine, Urine: 179.19 mg/dL
Protein Creatinine Ratio: 0.07 mg/mg{Cre} (ref 0.00–0.15)
Total Protein, Urine: 13 mg/dL

## 2021-07-16 LAB — RPR: RPR Ser Ql: NONREACTIVE

## 2021-07-16 MED ORDER — FENTANYL-BUPIVACAINE-NACL 0.5-0.125-0.9 MG/250ML-% EP SOLN
12.0000 mL/h | EPIDURAL | Status: DC | PRN
  Filled 2021-07-16: qty 250

## 2021-07-16 MED ORDER — OXYTOCIN 10 UNIT/ML IJ SOLN: 10.0000 [IU] | Freq: Once | INTRAMUSCULAR | Status: DC | PRN

## 2021-07-16 MED ORDER — OXYCODONE-ACETAMINOPHEN 5-325 MG PO TABS
1.0000 | ORAL_TABLET | ORAL | Status: DC | PRN
Start: 2021-07-16 — End: 2021-07-16

## 2021-07-16 MED ORDER — ZOLPIDEM TARTRATE 5 MG PO TABS: 5.0000 mg | ORAL_TABLET | Freq: Every evening | ORAL | Status: DC | PRN

## 2021-07-16 MED ORDER — LACTATED RINGERS IV SOLN: 500.0000 mL | INTRAVENOUS | Status: DC | PRN

## 2021-07-16 MED ORDER — EPHEDRINE 5 MG/ML INJ: 10.0000 mg | INTRAVENOUS | Status: DC | PRN

## 2021-07-16 MED ORDER — OXYCODONE-ACETAMINOPHEN 5-325 MG PO TABS: 2.0000 | ORAL_TABLET | ORAL | Status: DC | PRN

## 2021-07-16 MED ORDER — LIDOCAINE HCL (PF) 1 % IJ SOLN
INTRAMUSCULAR | Status: DC | PRN
  Administered 2021-07-16: 2 mL via EPIDURAL
  Administered 2021-07-16: 10 mL via EPIDURAL

## 2021-07-16 MED ORDER — DIPHENHYDRAMINE HCL 50 MG/ML IJ SOLN: 12.5000 mg | INTRAMUSCULAR | Status: DC | PRN

## 2021-07-16 MED ORDER — PHENYLEPHRINE 40 MCG/ML (10ML) SYRINGE FOR IV PUSH (FOR BLOOD PRESSURE SUPPORT)
80.0000 ug | PREFILLED_SYRINGE | INTRAVENOUS | Status: DC | PRN
  Filled 2021-07-16: qty 10

## 2021-07-16 MED ORDER — PHENYLEPHRINE 40 MCG/ML (10ML) SYRINGE FOR IV PUSH (FOR BLOOD PRESSURE SUPPORT): 80.0000 ug | PREFILLED_SYRINGE | INTRAVENOUS | Status: DC | PRN

## 2021-07-16 MED ORDER — OXYTOCIN-SODIUM CHLORIDE 30-0.9 UT/500ML-% IV SOLN: 1.0000 m[IU]/min | INTRAVENOUS | Status: DC

## 2021-07-16 MED ORDER — ACETAMINOPHEN 325 MG PO TABS: 650.0000 mg | ORAL_TABLET | ORAL | Status: DC | PRN

## 2021-07-16 MED ORDER — DIPHENHYDRAMINE HCL 25 MG PO CAPS: 25.0000 mg | ORAL_CAPSULE | Freq: Four times a day (QID) | ORAL | Status: DC | PRN

## 2021-07-16 MED ORDER — LACTATED RINGERS IV SOLN: 500.0000 mL | Freq: Once | INTRAVENOUS | Status: DC

## 2021-07-16 MED ORDER — WITCH HAZEL-GLYCERIN EX PADS: 1.0000 "application " | MEDICATED_PAD | CUTANEOUS | Status: DC | PRN

## 2021-07-16 MED ORDER — TETANUS-DIPHTH-ACELL PERTUSSIS 5-2.5-18.5 LF-MCG/0.5 IM SUSY: 0.5000 mL | PREFILLED_SYRINGE | Freq: Once | INTRAMUSCULAR | Status: DC

## 2021-07-16 MED ORDER — OXYTOCIN-SODIUM CHLORIDE 30-0.9 UT/500ML-% IV SOLN
2.5000 [IU]/h | INTRAVENOUS | Status: DC
  Filled 2021-07-16: qty 500

## 2021-07-16 MED ORDER — ONDANSETRON HCL 4 MG/2ML IJ SOLN: 4.0000 mg | INTRAMUSCULAR | Status: DC | PRN

## 2021-07-16 MED ORDER — LACTATED RINGERS IV SOLN: INTRAVENOUS | Status: DC

## 2021-07-16 MED ORDER — SENNOSIDES-DOCUSATE SODIUM 8.6-50 MG PO TABS
2.0000 | ORAL_TABLET | Freq: Every day | ORAL | Status: DC
  Administered 2021-07-17 – 2021-07-18 (×2): 2 via ORAL
  Filled 2021-07-16 (×2): qty 2

## 2021-07-16 MED ORDER — COCONUT OIL OIL: 1.0000 "application " | TOPICAL_OIL | Status: DC | PRN

## 2021-07-16 MED ORDER — OXYTOCIN-SODIUM CHLORIDE 30-0.9 UT/500ML-% IV SOLN
1.0000 m[IU]/min | INTRAVENOUS | Status: DC
  Administered 2021-07-16: 2 m[IU]/min via INTRAVENOUS

## 2021-07-16 MED ORDER — BENZOCAINE-MENTHOL 20-0.5 % EX AERO
1.0000 "application " | INHALATION_SPRAY | CUTANEOUS | Status: DC | PRN
  Filled 2021-07-16: qty 56

## 2021-07-16 MED ORDER — TERBUTALINE SULFATE 1 MG/ML IJ SOLN: 0.2500 mg | Freq: Once | INTRAMUSCULAR | Status: DC | PRN

## 2021-07-16 MED ORDER — ONDANSETRON HCL 4 MG PO TABS: 4.0000 mg | ORAL_TABLET | ORAL | Status: DC | PRN

## 2021-07-16 MED ORDER — FENTANYL CITRATE (PF) 100 MCG/2ML IJ SOLN
100.0000 ug | INTRAMUSCULAR | Status: DC | PRN
  Administered 2021-07-16 (×3): 100 ug via INTRAVENOUS
  Filled 2021-07-16 (×3): qty 2

## 2021-07-16 MED ORDER — SIMETHICONE 80 MG PO CHEW: 80.0000 mg | CHEWABLE_TABLET | ORAL | Status: DC | PRN

## 2021-07-16 MED ORDER — IBUPROFEN 600 MG PO TABS
600.0000 mg | ORAL_TABLET | Freq: Four times a day (QID) | ORAL | Status: DC
  Administered 2021-07-17 – 2021-07-18 (×7): 600 mg via ORAL
  Filled 2021-07-16 (×7): qty 1

## 2021-07-16 MED ORDER — TERBUTALINE SULFATE 1 MG/ML IJ SOLN
0.2500 mg | Freq: Once | INTRAMUSCULAR | Status: DC | PRN
Start: 2021-07-16 — End: 2021-07-16

## 2021-07-16 MED ORDER — LIDOCAINE HCL (PF) 1 % IJ SOLN: 30.0000 mL | INTRAMUSCULAR | Status: DC | PRN

## 2021-07-16 MED ORDER — DIBUCAINE (PERIANAL) 1 % EX OINT: 1.0000 "application " | TOPICAL_OINTMENT | CUTANEOUS | Status: DC | PRN

## 2021-07-16 MED ORDER — NIFEDIPINE ER OSMOTIC RELEASE 30 MG PO TB24
30.0000 mg | ORAL_TABLET | Freq: Every day | ORAL | Status: DC
  Administered 2021-07-17 – 2021-07-18 (×2): 30 mg via ORAL
  Filled 2021-07-16 (×2): qty 1

## 2021-07-16 MED ORDER — FENTANYL-BUPIVACAINE-NACL 0.5-0.125-0.9 MG/250ML-% EP SOLN
EPIDURAL | Status: DC | PRN
  Administered 2021-07-16: 12 mL/h via EPIDURAL

## 2021-07-16 MED ORDER — OXYTOCIN BOLUS FROM INFUSION
333.0000 mL | Freq: Once | INTRAVENOUS | Status: AC
Start: 2021-07-16 — End: 2021-07-16
  Administered 2021-07-16: 333 mL via INTRAVENOUS

## 2021-07-16 MED ORDER — ONDANSETRON HCL 4 MG/2ML IJ SOLN
4.0000 mg | Freq: Four times a day (QID) | INTRAMUSCULAR | Status: DC | PRN
  Administered 2021-07-16: 4 mg via INTRAVENOUS
  Filled 2021-07-16: qty 2

## 2021-07-16 MED ORDER — SOD CITRATE-CITRIC ACID 500-334 MG/5ML PO SOLN: 30.0000 mL | ORAL | Status: DC | PRN

## 2021-07-16 MED ORDER — ACETAMINOPHEN 325 MG PO TABS
650.0000 mg | ORAL_TABLET | ORAL | Status: DC | PRN
  Administered 2021-07-17: 650 mg via ORAL
  Filled 2021-07-16: qty 2

## 2021-07-16 MED ORDER — PRENATAL MULTIVITAMIN CH
1.0000 | ORAL_TABLET | Freq: Every day | ORAL | Status: DC
  Administered 2021-07-17 – 2021-07-18 (×2): 1 via ORAL
  Filled 2021-07-16 (×2): qty 1

## 2021-07-16 NOTE — Discharge Summary (Signed)
Postpartum Discharge Summary       Patient Name: Jenny Carr DOB: 1986-04-17 MRN: [id number]  Date of admission: 07/16/2021 Delivery date:07/16/2021  Delivering provider: Gerlene Fee  Date of discharge: 07/18/21  Admitting diagnosis: Indication for care in labor or delivery [O75.9] Intrauterine pregnancy: [redacted]w[redacted]d    Secondary diagnosis:  Active Problems:   Obesity affecting pregnancy, antepartum   Alpha thalassemia silent carrier   Genetic carrier   Gestational hypertension   Indication for care in labor or delivery  Additional problems: none    Discharge diagnosis: Term Pregnancy Delivered and Gestational Hypertension                                              Post partum procedures:none Augmentation: AROM, Pitocin, and OP Foley Complications: None  Hospital course: Induction of Labor With Vaginal Delivery   35y.o. yo G3P2002 at [id number] admitted to the hospital 07/16/2021 for induction of labor.  Indication for induction: Gestational hypertension.  Patient had an uncomplicated labor course as follows: Membrane Rupture Time/Date: 2:24 PM ,07/16/2021   Delivery Method:Vaginal, Spontaneous  Episiotomy: None  Lacerations:  1st degree  Details of delivery can be found in separate delivery note.  Patient had a routine postpartum course. Patient is discharged home 07/20/21.  Newborn Data: Birth date:07/16/2021  Birth time:5:47 PM  Gender:Female  Living status:Living  Apgars:8 ,9  Weight:7 lb 1.6 oz (3.22 kg)   Magnesium Sulfate received: No BMZ received: No Rhophylac:No MMR:No T-DaP:Given prenatally Flu: No Transfusion:No  Physical exam  Vitals:   07/17/21 1450 07/17/21 2044 07/17/21 2159 07/18/21 0507  BP: (!) 142/82 140/80 128/73 132/78  Pulse: 90 82 80 84  Resp: _0 Temp: 97.9 F (36.6 C) 98.3 F (36.8 C)  97.7 F (36.5 C)  TempSrc: Oral Oral  Oral  SpO2: 100% 100%  98%  Weight:      Height:       General: alert, cooperative, and  no distress Lochia: appropriate Uterine Fundus: firm Incision: N/A DVT Evaluation: No evidence of DVT seen on physical exam. Negative Homan's sign. No cords or calf tenderness. Labs: Lab Results  Component Value Date   WBC 10.5 07/17/2021   HGB 8.9 (L) 07/17/2021   HCT 28.2 (L) 07/17/2021   MCV 76.0 (L) 07/17/2021   PLT 220 07/17/2021   CMP Latest Ref Rng & Units 07/16/2021  Glucose 70 - 99 mg/dL 98  BUN 6 - 20 mg/dL 5(L)  Creatinine 0.44 - 1.00 mg/dL 0.62  Sodium 135 - 145 mmol/L 134(L)  Potassium 3.5 - 5.1 mmol/L 3.5  Chloride 98 - 111 mmol/L 103  CO2 22 - 32 mmol/L 22  Calcium 8.9 - 10.3 mg/dL 9.6  Total Protein 6.5 - 8.1 g/dL 6.4(L)  Total Bilirubin 0.3 - 1.2 mg/dL 0.8  Alkaline Phos 38 - 126 U/L 119  AST 15 - 41 U/L 16  ALT 0 - 44 U/L 12   Edinburgh Score: Edinburgh Postnatal Depression Scale Screening Tool 07/18/2021  I have been able to laugh and see the funny side of things. 0  I have looked forward with enjoyment to things. 0  I have blamed myself unnecessarily when things went wrong. 2  I have been anxious or worried for no good reason. 1  I have felt scared or panicky for no good reason.  0  Things have been getting on top of me. 1  I have been so unhappy that I have had difficulty sleeping. 0  I have felt sad or miserable. 0  I have been so unhappy that I have been crying. 0  The thought of harming myself has occurred to me. 0  Edinburgh Postnatal Depression Scale Total 4     After visit meds:  Allergies as of 07/18/2021   No Known Allergies      Medication List     STOP taking these medications    aspirin 81 MG chewable tablet       TAKE these medications    ibuprofen 600 MG tablet Commonly known as: ADVIL Take 1 tablet (600 mg total) by mouth every 6 (six) hours.   NIFEdipine 30 MG 24 hr tablet Commonly known as: ADALAT CC Take 1 tablet (30 mg total) by mouth daily.   Prenatal 27-1 MG Tabs Take 1 tablet by mouth daily.          Discharge home in stable condition Infant Feeding: Breast Infant Disposition:home with mother Discharge instruction: per After Visit Summary and Postpartum booklet. Activity: Advance as tolerated. Pelvic rest for 6 weeks.  Diet: routine diet Future Appointments: Future Appointments  Date Time Provider [place]  07/24/2021 10:00 AM [facility] NURSE [facility] [place]  08/16/2021  8:15 AM Radene Gunning, MD [facility]  08/16/2021 10:15 AM Gavin Pound, CNM [facility] None   Follow up Visit:  Follow-up Information     [place] Follow up on 07/24/2021.   Specialty: Obstetrics and Gynecology Why: for blood pressure check and to sign tubal papers Contact information: [place] [phone]                Message sent by Dr Higinio Plan to Idamae Schuller on 07/16/2021:   Please schedule this patient for a In person postpartum visit in 4 weeks with the following provider: Any provider. Additional Postpartum F/U:BP check 1 week  High risk pregnancy complicated by: HTN Delivery mode:  Vaginal, Spontaneous  Anticipated Birth Control:  Plans Interval BTL   07/20/2021 Christin Fudge, CNM

## 2021-07-16 NOTE — H&P (Signed)
LABOR AND DELIVERY ADMISSION HISTORY AND PHYSICAL NOTE  Jenny Carr is a 35 y.o. female G56P2002 with IUP at [redacted]w[redacted]d by first trimester [place] presenting for IOL 2/2 gHTN. She had an outpatient FB placed yesterday.  She reports positive fetal movement. She denies leakage of fluid or vaginal bleeding. Denies any HA, blurred vision, RUQ ab pain, SOB, or significant LE swelling.   Prenatal History/Complications:  PNC at [facility] @[redacted]  wks gestation  Pregnancy complications:  - gHTN  - UTI  - Obesity (BMI 47)  ASA 81 - Alpha thalassemia silent carrier; declined genetic counseling/partner testing -Increased risk of SMA; declined the above   Past Medical History: Past Medical History:  Diagnosis Date   Pregnancy induced hypertension    UTI (urinary tract infection) during pregnancy 12/23/2020   12/23/20>TOC    Past Surgical History: History reviewed. No pertinent surgical history.  Obstetrical History: OB History     Gravida  3   Para  2   Term  2   Preterm      AB      Living  2      SAB      IAB      Ectopic      Multiple  0   Live Births  2           Social History: Social History   Socioeconomic History   Marital status: Single    Spouse name: Not on file   Number of children: 2   Years of education: Not on file   Highest education level: Bachelor's degree (e.g., BA, AB, BS)  Occupational History   Occupation: customer service rep    Employer: 02/22/21  Tobacco Use   Smoking status: Never   Smokeless tobacco: Never  Vaping Use   Vaping Use: Never used  Substance and Sexual Activity   Alcohol use: Not Currently    Comment: social   Drug use: Never   Sexual activity: Yes    Birth control/protection: None    Comment: Nexplanon removed 06/2020  Other Topics Concern   Not on file  Social History Narrative   Not on file   Social Determinants of Health   Financial Resource Strain: Low Risk    Difficulty of Paying Living Expenses: Not  hard at all  Food Insecurity: No Food Insecurity   Worried About 07/2020 in the Last Year: Never true   Ran Out of Food in the Last Year: Never true  Transportation Needs: No Transportation Needs   Lack of Transportation (Medical): No   Lack of Transportation (Non-Medical): No  Physical Activity: Inactive   Days of Exercise per Week: 0 days   Minutes of Exercise per Session: 0 min  Stress: No Stress Concern Present   Feeling of Stress : Not at all  Social Connections: Not on file    Family History: Family History  Problem Relation Age of Onset   Cancer Father     Allergies: No Known Allergies  Medications Prior to Admission  Medication Sig Dispense Refill Last Dose   aspirin 81 MG chewable tablet Chew 1 tablet (81 mg total) by mouth daily. 30 tablet 6    Prenatal 27-1 MG TABS Take 1 tablet by mouth daily. 30 tablet 12      Review of Systems  All systems reviewed and negative except as stated in HPI  Physical Exam Blood pressure (!) 142/89, pulse 93, resp. rate 18, height 5\' 1"  (1.549 m),  weight 114.8 kg. General appearance: alert, oriented, NAD Lungs: normal respiratory effort Heart: regular rate Abdomen: soft, non-tender; gravid, FH appropriate for GA Extremities: No calf swelling or tenderness Presentation: cephalic Fetal monitoring: 135/mod/15x15/none Uterine activity: Every 2-3 min  Dilation: 6.5 Effacement (%): 60 Station: -3 Exam by:: Henderson Newcomer  Prenatal labs: ABO, Rh: --/--/A POS (10/02 6073) Antibody: NEG (10/02 7106) Rubella: 7.45 (03/08 1504) RPR: Non Reactive (07/28 0823)  HBsAg: Negative (03/08 1504)  HIV: Non Reactive (07/28 0823)  GC/Chlamydia: Negative  GBS: Negative/-- (09/22 1053)  2-hr GTT: 118 Genetic screening:  Low risk, neg Anatomy US: Normal but incomplete views, follow up wnl  Prenatal Transfer Tool  Maternal Diabetes: No Genetic Screening: Normal Maternal Ultrasounds/Referrals: Normal Fetal Ultrasounds or  other Referrals:  None Maternal Substance Abuse:  No Significant Maternal Medications:  None Significant Maternal Lab Results: Group B Strep negative  Results for orders placed or performed during the hospital encounter of 07/16/21 (from the past 24 hour(s))  CBC with Differential/Platelet   Collection Time: 07/16/21  9:23 AM  Result Value Ref Range   WBC 7.4 4.0 - 10.5 K/uL   RBC 4.65 3.87 - 5.11 MIL/uL   Hemoglobin 11.2 (L) 12.0 - 15.0 g/dL   HCT 26.9 (L) 48.5 - 46.2 %   MCV 75.1 (L) 80.0 - 100.0 fL   MCH 24.1 (L) 26.0 - 34.0 pg   MCHC 32.1 30.0 - 36.0 g/dL   RDW 70.3 50.0 - 93.8 %   Platelets 259 150 - 400 K/uL   nRBC 0.0 0.0 - 0.2 %   Neutrophils Relative % 66 %   Neutro Abs 4.9 1.7 - 7.7 K/uL   Lymphocytes Relative 23 %   Lymphs Abs 1.7 0.7 - 4.0 K/uL   Monocytes Relative 8 %   Monocytes Absolute 0.6 0.1 - 1.0 K/uL   Eosinophils Relative 2 %   Eosinophils Absolute 0.1 0.0 - 0.5 K/uL   Basophils Relative 0 %   Basophils Absolute 0.0 0.0 - 0.1 K/uL   Immature Granulocytes 1 %   Abs Immature Granulocytes 0.04 0.00 - 0.07 K/uL  Type and screen   Collection Time: 07/16/21  9:23 AM  Result Value Ref Range   ABO/RH(D) A POS    Antibody Screen NEG    Sample Expiration      07/19/2021,2359 Performed at [facility], [address] [place], [place] [address]     Patient Active Problem List   Diagnosis Date Noted   Indication for care in labor or delivery 07/16/2021   Gestational hypertension 06/22/2021   Alpha thalassemia silent carrier 01/17/2021   Genetic carrier 01/17/2021   Supervision of high risk pregnancy, antepartum 12/20/2020   Obesity affecting pregnancy, antepartum    Obesity (BMI 30-39.9) 04/30/2011    Assessment: Jenny Carr is a 35 y.o. G3P2002 at [redacted]w[redacted]d here for IOL due to gestational hypertension.   #Labor: Progressing well on her own, s/p outpatient FB and 6-7cm already on arrival. Will allow expectant management for now and consider  AROM if needed.  #Pain: PRN #FWB: Cat 1  #ID: GBS negative  #MOF: Breastfeeding #MOC: Desires nexplanon, will be placed outpatient  #Circ: NA girl   #Gestational hypertension: No severe ranges. Asymptomatic. Will obtain baseline pre-e labs.     [name] 07/16/2021, 10:53 AM

## 2021-07-16 NOTE — Lactation Note (Signed)
This note was copied from a baby's chart. Lactation Consultation Note  Patient Name: Jenny Carr RUEAV'W Date: 07/16/2021 Reason for consult: Initial assessment;Early term 37-38.6wks Age:33 hours  Initial visit to 5 hours old infant of a P3 mother. Mother requests assistance with latch. Mother expresses concern due to low milk supply with her other two children. Mother reports no breast changes while pregnant nor any evidence of colostrum during the last week.  Demonstrated hand expression, verbalized discomfort , unable to see colostrum. Provided hand pump for colostrum expression, unable to collect colostrum.  Infant is able to latch easily, breastfed for ~12 minutes and fell asleep. Infant seems sleepy and uninterested after that.  Discussed normal newborn behavior and patterns, signs of good milk transfer, hunger cues, tummy size and benefits of skin to skin.   Plan: 1-Breastfeeding on demand or 8-12 times in 24h period. 2-Use manual pump as needed 3-Encouraged maternal rest, hydration and food intake.   Contact LC as needed for feeds/support/concerns/questions. All questions answered at this time. Provided Lactation services brochure and promoted [employer] booklet information.      Maternal Data Has patient been taught Hand Expression?: Yes Does the patient have breastfeeding experience prior to this delivery?: Yes How long did the patient breastfeed?: 4 weeks x2  Feeding Mother's Current Feeding Choice: Breast Milk  LATCH Score Latch: Grasps breast easily, tongue down, lips flanged, rhythmical sucking.  Audible Swallowing: None  Type of Nipple: Everted at rest and after stimulation  Comfort (Breast/Nipple): Soft / non-tender (some edema)  Hold (Positioning): Assistance needed to correctly position infant at breast and maintain latch.  LATCH Score: 7   Lactation Tools Discussed/Used Tools: Pump;Flanges Flange Size: 27 Breast pump type: Manual Pump Education:  Milk Storage;Setup, frequency, and cleaning Reason for Pumping: mother's request Pumping frequency: as needed Pumped volume:  (unable to see colostrum)  Interventions Interventions: Breast feeding basics reviewed;Assisted with latch;Skin to skin;Hand express;Breast massage;Adjust position;Breast compression;Support pillows;Position options;Hand pump;Education  Discharge    Consult Status Consult Status: Follow-up Date: 07/17/21 Follow-up type: In-patient    Izmael Duross A Higuera Ancidey 07/16/2021, 11:08 PM

## 2021-07-16 NOTE — Progress Notes (Addendum)
LABOR PROGRESS NOTE  Jenny Carr is a 35 y.o. G3P2002 at [redacted]w[redacted]d  admitted for IOL 2/2 gHTN.   Subjective: Doing well no concerns at this time.   Objective: BP 132/83   Pulse 96   Temp 98.6 F (37 C) (Axillary)   Resp 16   Ht 5\' 1"  (1.549 m)   Wt 114.8 kg   LMP  (LMP Unknown)   BMI 47.80 kg/m  or  Vitals:   07/16/21 0958 07/16/21 1050 07/16/21 1154 07/16/21 1250  BP: (!) 142/88 (!) 142/89 135/86 132/83  Pulse: (!) 101 93 86 96  Resp: 18 20 18 16   Temp:      TempSrc:      Weight:      Height:        Dilation: 6.5 Effacement (%): 60 Station: -3 Presentation: Vertex Exam by:: FHT: baseline rate 135, moderate varibility, 15x15acel, no decel Toco: 2-3 mins, variability  Labs: Lab Results  Component Value Date   WBC 7.4 07/16/2021   HGB 11.2 (L) 07/16/2021   HCT 34.9 (L) 07/16/2021   MCV 75.1 (L) 07/16/2021   PLT 259 07/16/2021    Patient Active Problem List   Diagnosis Date Noted   Indication for care in labor or delivery 07/16/2021   Gestational hypertension 06/22/2021   Alpha thalassemia silent carrier 01/17/2021   Genetic carrier 01/17/2021   Supervision of high risk pregnancy, antepartum 12/20/2020   Obesity affecting pregnancy, antepartum    Obesity (BMI 30-39.9) 04/30/2011    Assessment / Plan: 35 y.o. G3P2002 at [redacted]w[redacted]d here for IOL 2/2 gHTN.   Labor: Progressing. S/p OP FB. Consented for AROM; clear fluid fetus head well applied to cervix.   Fetal Wellbeing:  CAT I Pain Control:  Maternally supported Anticipated MOD: Vaginal  Deontaye Civello Autry-Lott, DO 07/16/2021, 2:39 PM PGY-3, [facility]

## 2021-07-16 NOTE — Anesthesia Preprocedure Evaluation (Signed)
Anesthesia Evaluation  Patient identified by MRN, date of birth, ID band Patient awake    Reviewed: Allergy & Precautions, Patient's Chart, lab work & pertinent test results  Airway Mallampati: II  TM Distance: >3 FB Neck ROM: Full    Dental no notable dental hx.    Pulmonary neg pulmonary ROS,    Pulmonary exam normal breath sounds clear to auscultation       Cardiovascular hypertension (gest HTN), Normal cardiovascular exam Rhythm:Regular Rate:Normal     Neuro/Psych negative neurological ROS  negative psych ROS   GI/Hepatic negative GI ROS, Neg liver ROS,   Endo/Other  Morbid obesityBMI 48  Renal/GU negative Renal ROS  negative genitourinary   Musculoskeletal negative musculoskeletal ROS (+)   Abdominal (+) + obese,   Peds negative pediatric ROS (+)  Hematology negative hematology ROS (+) hct 34.9, plt 259   Anesthesia Other Findings   Reproductive/Obstetrics (+) Pregnancy                             Anesthesia Physical Anesthesia Plan  ASA: 3  Anesthesia Plan: Epidural   Post-op Pain Management:    Induction:   PONV Risk Score and Plan: 2  Airway Management Planned: Natural Airway  Additional Equipment: None  Intra-op Plan:   Post-operative Plan:   Informed Consent: I have reviewed the patients History and Physical, chart, labs and discussed the procedure including the risks, benefits and alternatives for the proposed anesthesia with the patient or authorized representative who has indicated his/her understanding and acceptance.       Plan Discussed with:   Anesthesia Plan Comments:         Anesthesia Quick Evaluation

## 2021-07-16 NOTE — Anesthesia Procedure Notes (Signed)
Epidural Patient location during procedure: OB Start time: 07/16/2021 5:15 PM End time: 07/16/2021 5:29 PM  Staffing Anesthesiologist: Lannie Fields, DO Performed: anesthesiologist   Preanesthetic Checklist Completed: patient identified, IV checked, risks and benefits discussed, monitors and equipment checked, pre-op evaluation and timeout performed  Epidural Patient position: sitting Prep: DuraPrep and site prepped and draped Patient monitoring: continuous pulse ox, blood pressure, heart rate and cardiac monitor Approach: midline Location: L3-L4 Injection technique: LOR air  Needle:  Needle type: Tuohy  Needle gauge: 17 G Needle length: 9 cm Needle insertion depth: 9 (9++++) cm Catheter type: closed end flexible Catheter size: 19 Gauge Catheter at skin depth: 15 cm Test dose: negative  Assessment Sensory level: T8 Events: blood not aspirated, injection not painful, no injection resistance, no paresthesia and negative IV test  Additional Notes Patient identified. Risks/Benefits/Options discussed with patient including but not limited to bleeding, infection, nerve damage, paralysis, failed block, incomplete pain control, headache, blood pressure changes, nausea, vomiting, reactions to medication both or allergic, itching and postpartum back pain. Confirmed with bedside nurse the patient's most recent platelet count. Confirmed with patient that they are not currently taking any anticoagulation, have any bleeding history or any family history of bleeding disorders. Patient expressed understanding and wished to proceed. All questions were answered. Sterile technique was used throughout the entire procedure. Please see nursing notes for vital signs. Test dose was given through epidural catheter and negative prior to continuing to dose epidural or start infusion. Warning signs of high block given to the patient including shortness of breath, tingling/numbness in hands, complete  motor block, or any concerning symptoms with instructions to call for help. Patient was given instructions on fall risk and not to get out of bed. All questions and concerns addressed with instructions to call with any issues or inadequate analgesia.  Reason for block:procedure for pain

## 2021-07-16 NOTE — Lactation Note (Signed)
This note was copied from a baby's chart. Lactation Consultation Note  Patient Name: Jenny Carr [id number] Date: 07/16/2021 Reason for consult: L&D Initial assessment;Early term 37-38.6wks Age:33 hours  L&D consult with >60 minutes old infant and P3 mother. Congratulated family on newborn.  "Jenny Carr" is latched upon arrival and still breastfeeding as LC left room.   Discussed STS as ideal transition for infants after birth helping with temperature, blood sugar and comfort. Talked about primal reflexes such as rooting, hands to mouth, searching for the breast among others. Explained LC services availability during postpartum stay. Thanked family for their time.      Maternal Data Has patient been taught Hand Expression?: Yes Does the patient have breastfeeding experience prior to this delivery?: Yes How long did the patient breastfeed?: 4 weeks x2  Feeding Mother's Current Feeding Choice: Breast Milk  LATCH Score Latch: Grasps breast easily, tongue down, lips flanged, rhythmical sucking.  Audible Swallowing: Spontaneous and intermittent  Type of Nipple: Everted at rest and after stimulation  Comfort (Breast/Nipple): Soft / non-tender (edema)  Hold (Positioning): No assistance needed to correctly position infant at breast.  LATCH Score: 10  Interventions Interventions: Breast feeding basics reviewed;Hand express;Breast massage;Skin to skin;Education  Discharge Pump: Personal WIC Program: Yes  Consult Status Consult Status: Follow-up from L&D    Remo Kirschenmann A Higuera Ancidey 07/16/2021, 6:58 PM

## 2021-07-17 LAB — CBC
HCT: 28.2 % — ABNORMAL LOW (ref 36.0–46.0)
Hemoglobin: 8.9 g/dL — ABNORMAL LOW (ref 12.0–15.0)
MCH: 24 pg — ABNORMAL LOW (ref 26.0–34.0)
MCHC: 31.6 g/dL (ref 30.0–36.0)
MCV: 76 fL — ABNORMAL LOW (ref 80.0–100.0)
Platelets: 220 10*3/uL (ref 150–400)
RBC: 3.71 MIL/uL — ABNORMAL LOW (ref 3.87–5.11)
RDW: 14.7 % (ref 11.5–15.5)
WBC: 10.5 10*3/uL (ref 4.0–10.5)
nRBC: 0 % (ref 0.0–0.2)

## 2021-07-17 NOTE — Anesthesia Postprocedure Evaluation (Signed)
Anesthesia Post Note  Patient: Jenny Carr  Procedure(s) Performed: AN AD HOC LABOR EPIDURAL     Patient location during evaluation: Mother Baby Anesthesia Type: Epidural Level of consciousness: awake, oriented and awake and alert Pain management: pain level controlled Vital Signs Assessment: post-procedure vital signs reviewed and stable Respiratory status: spontaneous breathing, respiratory function stable and nonlabored ventilation Cardiovascular status: stable Postop Assessment: no headache, adequate PO intake, able to ambulate, patient able to bend at knees, no backache and no apparent nausea or vomiting Anesthetic complications: no   No notable events documented.  Last Vitals:  Vitals:   07/17/21 0614 07/17/21 0947  BP: 126/73 128/74  Pulse: 81 82  Resp: 16 16  Temp: 36.8 C 36.8 C  SpO2: 98% 100%    Last Pain:  Vitals:   07/17/21 0947  TempSrc: Oral  PainSc: 0-No pain   Pain Goal: Patients Stated Pain Goal: 4 (07/16/21 1930)                 Truitt Leep

## 2021-07-17 NOTE — Progress Notes (Signed)
Post Partum Day 1 Subjective: no complaints, up ad lib, voiding, tolerating PO, and + flatus  Objective: Blood pressure (!) 142/82, pulse 90, temperature 97.9 F (36.6 C), temperature source Oral, resp. rate 18, height 5\' 1"  (1.549 m), weight 114.8 kg, SpO2 100 %, unknown if currently breastfeeding.  Physical Exam:  General: alert, cooperative, and no distress Lochia: appropriate Uterine Fundus: firm Incision: n/a DVT Evaluation: No evidence of DVT seen on physical exam.  Recent Labs    07/16/21 0923 07/17/21 0429  HGB 11.2* 8.9*  HCT 34.9* 28.2*    Assessment/Plan: Plan for discharge tomorrow Continue Procardia daily   LOS: 1 day   09/16/21 07/17/2021, 8:44 PM

## 2021-07-18 ENCOUNTER — Other Ambulatory Visit (HOSPITAL_COMMUNITY): Payer: Self-pay

## 2021-07-18 MED ORDER — NIFEDIPINE ER 30 MG PO TB24
30.0000 mg | ORAL_TABLET | Freq: Every day | ORAL | 0 refills | Status: DC
  Filled 2021-07-18: qty 30, 30d supply, fill #0

## 2021-07-18 MED ORDER — IBUPROFEN 600 MG PO TABS
600.0000 mg | ORAL_TABLET | Freq: Four times a day (QID) | ORAL | 0 refills | Status: DC
  Filled 2021-07-18: qty 30, 8d supply, fill #0

## 2021-07-18 NOTE — Lactation Note (Signed)
This note was copied from a baby's chart. Lactation Consultation Note  Patient Name: Jenny Carr [id number] Date: 07/18/2021 Reason for consult: Follow-up assessment Age:33 hours Mother reports that she has a DEBP at home. She was given a plan to begin to pump every 3 hours for 15-20 mins after each breastfeeding attempt.  Discussed supply and demand. Mother reports that she has a 77 yr old at home and that she should have plenty of time.  Mother has a hand pump . She was advised to use hand pump while at the hospital for 15 mins on each breast.  Discussed treatment and prevention of engorgement.  Mother was receptive to all teaching.  Maternal Data    Feeding Mother's Current Feeding Choice: Formula  LATCH Score                    Lactation Tools Discussed/Used    Interventions    Discharge Discharge Education: Engorgement and breast care;Warning signs for feeding baby;Outpatient recommendation  Consult Status Consult Status: Complete    Michel Bickers 07/18/2021, 3:49 PM

## 2021-07-19 ENCOUNTER — Telehealth: Payer: Self-pay

## 2021-07-19 ENCOUNTER — Other Ambulatory Visit: Payer: No Typology Code available for payment source

## 2021-07-19 NOTE — Telephone Encounter (Signed)
Transition Care Management Unsuccessful Follow-up Telephone Call  Date of discharge and from where:  10/4/202-[facility]  Attempts:  1st Attempt  Reason for unsuccessful TCM follow-up call:  Left voice message

## 2021-07-20 NOTE — Telephone Encounter (Signed)
Transition Care Management Unsuccessful Follow-up Telephone Call  Date of discharge and from where:  07/18/2021 from [facility]  Attempts:  2nd Attempt  Reason for unsuccessful TCM follow-up call:  Left voice message

## 2021-07-21 NOTE — Telephone Encounter (Signed)
Transition Care Management Follow-up Telephone Call Date of discharge and from where: 07/18/2021 from [facility] How have you been since you were released from the hospital? Pt stated that she is feeling well and did not have any questions or concerns at this time.  Any questions or concerns? No  Items Reviewed: Did the pt receive and understand the discharge instructions provided? Yes  Medications obtained and verified? Yes  Other? No  Any new allergies since your discharge? No  Dietary orders reviewed? No Do you have support at home? Yes   Functional Questionnaire: (I = Independent and D = Dependent) ADLs: I  Bathing/Dressing- I  Meal Prep- I  Eating- I  Maintaining continence- I  Transferring/Ambulation- I  Managing Meds- I   Follow up appointments reviewed:  PCP Hospital f/u appt confirmed? No   Specialist Hospital f/u appt confirmed? Yes  Scheduled to see [facility] Nurse on 07/24/2021 @ 10:00am. Are transportation arrangements needed? No  If their condition worsens, is the pt aware to call PCP or go to the Emergency Dept.? Yes Was the patient provided with contact information for the PCP's office or ED? Yes Was to pt encouraged to call back with questions or concerns? Yes

## 2021-07-23 ENCOUNTER — Encounter: Payer: Self-pay | Admitting: Family Medicine

## 2021-07-24 ENCOUNTER — Ambulatory Visit: Payer: No Typology Code available for payment source

## 2021-07-25 ENCOUNTER — Ambulatory Visit: Payer: No Typology Code available for payment source

## 2021-07-25 ENCOUNTER — Telehealth: Payer: Self-pay

## 2021-07-25 NOTE — Telephone Encounter (Signed)
[facility] patient. Did not keep BP check appt at [facility] today. Called pt; VM left stating I am calling pt regarding missed appt and requested pt call back or reply to MyChart message. MyChart message sent to patient. Message sent to [facility] office for follow up.

## 2021-07-26 ENCOUNTER — Telehealth (HOSPITAL_COMMUNITY): Payer: Self-pay | Admitting: *Deleted

## 2021-07-26 NOTE — Telephone Encounter (Signed)
Hospital Discharge Follow-Up Call:  Patient reports that she is doing well.  Had questions about lochia.  We reviewed normal amounts/patterns and when she should call her provider.  EPDS today was 0 and she endorses this accurately reflects that she is doing well emotionally.  Patient reports that baby is doing well and she has no concerns about baby's health.  She says that baby sleeps in a bassinet in her bedroom.  Reviewed ABCs of Safe Sleep.

## 2021-07-27 ENCOUNTER — Other Ambulatory Visit: Payer: Self-pay

## 2021-07-27 ENCOUNTER — Ambulatory Visit (INDEPENDENT_AMBULATORY_CARE_PROVIDER_SITE_OTHER): Payer: Medicaid Other | Admitting: *Deleted

## 2021-07-27 VITALS — Ht 61.0 in

## 2021-07-27 DIAGNOSIS — O165 Unspecified maternal hypertension, complicating the puerperium: Secondary | ICD-10-CM

## 2021-08-03 ENCOUNTER — Other Ambulatory Visit (HOSPITAL_COMMUNITY): Payer: Self-pay

## 2021-08-16 ENCOUNTER — Ambulatory Visit (INDEPENDENT_AMBULATORY_CARE_PROVIDER_SITE_OTHER): Payer: No Typology Code available for payment source

## 2021-08-16 ENCOUNTER — Ambulatory Visit: Payer: No Typology Code available for payment source | Admitting: Obstetrics and Gynecology

## 2021-08-16 ENCOUNTER — Other Ambulatory Visit: Payer: Self-pay

## 2021-08-16 DIAGNOSIS — Z302 Encounter for sterilization: Secondary | ICD-10-CM

## 2021-08-16 NOTE — Progress Notes (Signed)
Post Partum Visit Note  Jenny Carr is a 35 y.o. G12P3003 female who presents for a postpartum visit. She is 4 weeks postpartum following a normal spontaneous vaginal delivery.  I have fully reviewed the prenatal and intrapartum course. The delivery was at [redacted]w[redacted]d gestational weeks.  Anesthesia: epidural. Postpartum course has been uncomplicated. Baby is doing well. Baby is feeding by bottle - Enfamil with Iron. Bleeding no bleeding. Bowel function is normal. Bladder function is normal. Patient is sexually active. Contraception method is none. Postpartum depression screening: negative.  Patient denies depression.  She receives support from FOB when possible. She endorses safety at home and denies DV/A.  She is anticipating a PP BTL and is scheduled for consult on Aug 23, 2021 with Dr. Marcie Bal. She reports some initial discomfort with sexual activity, that resolved without incident.   The pregnancy intention screening data noted above was reviewed. Potential methods of contraception were discussed. The patient elected to proceed with No data recorded.   Edinburgh Postnatal Depression Scale - 08/16/21 1036       Edinburgh Postnatal Depression Scale:  In the Past 7 Days   I have been able to laugh and see the funny side of things. 0    I have looked forward with enjoyment to things. 0    I have blamed myself unnecessarily when things went wrong. 0    I have been anxious or worried for no good reason. 0    I have felt scared or panicky for no good reason. 0    Things have been getting on top of me. 0    I have been so unhappy that I have had difficulty sleeping. 0    I have felt sad or miserable. 0    I have been so unhappy that I have been crying. 0    The thought of harming myself has occurred to me. 0    Edinburgh Postnatal Depression Scale Total 0             Health Maintenance Due  Topic Date Due   COVID-19 Vaccine (1) Never done   PAP SMEAR-Modifier  Never done    INFLUENZA VACCINE  05/15/2021    The following portions of the patient's history were reviewed and updated as appropriate: allergies, current medications, past family history, past medical history, past social history, past surgical history, and problem list.  Review of Systems Pertinent items noted in HPI and remainder of comprehensive ROS otherwise negative.  Objective:  BP 133/87 (BP Location: Left Arm, Patient Position: Sitting, Cuff Size: Large)   Pulse 83   Temp 97.9 F (36.6 C) (Oral)   Ht 5\' 1"  (1.549 m)   Wt 241 lb 12.8 oz (109.7 kg)   LMP  (LMP Unknown)   Breastfeeding No   BMI 45.69 kg/m    General:  alert, cooperative, and no distress   Breasts:  normal-Denies issues  Lungs: clear to auscultation bilaterally  Heart:  regular rate and rhythm  Abdomen: soft, non-tender; bowel sounds normal; no masses,  no organomegaly   Wound N/A  GU exam:  not indicated       Assessment:   4 week postpartum exam.  Normal [employer] GHTN Desires BTL  Plan:   Informed that bp within normal high range. Instructed to continue to monitor and report any elevations to PCP. Okay to resume normal activities as desired Follow up in one year or prn for annual visit or concerns.  Essential  components of care per ACOG recommendations:  1.  Mood and well being: Patient with negative depression screening today. Reviewed local resources for support.  - Patient tobacco use? No.   - hx of drug use? No.    2. Infant care and feeding:  -Patient currently breastmilk feeding? No.  -Social determinants of health (SDOH) reviewed in EPIC. No concerns  3. Sexuality, contraception and birth spacing - Patient does not want a pregnancy in the next year.  Desired family size is 3 children.  - Reviewed forms of contraception in tiered fashion. Patient desired bilateral tubal ligation today.   - Discussed birth spacing of 18 months  4. Sleep and fatigue -Encouraged family/partner/community  support of 4 hrs of uninterrupted sleep to help with mood and fatigue  5. Physical Recovery  - Discussed patients delivery and complications. She describes her labor as mixed. "This delivery was something else."  - Patient had a Vaginal, no problems at delivery. Patient had a  perineal  laceration that was left unrepaired. Perineal healing reviewed. Patient expressed understanding - Patient has urinary incontinence? No. - Patient is safe to resume physical and sexual activity  6.  Health Maintenance - HM due items addressed Yes - Last pap smear No results found for: DIAGPAP Pap smear not done at today's visit.  -Breast Cancer screening indicated? No.   7. Chronic Disease/Pregnancy Condition follow up: None  - PCP follow up  Cherre Robins, CNM Center for [employer], [facility]

## 2021-08-23 ENCOUNTER — Telehealth (INDEPENDENT_AMBULATORY_CARE_PROVIDER_SITE_OTHER): Payer: Commercial Managed Care - PPO | Admitting: Obstetrics & Gynecology

## 2021-08-23 DIAGNOSIS — Z3009 Encounter for other general counseling and advice on contraception: Secondary | ICD-10-CM

## 2021-08-23 DIAGNOSIS — Z302 Encounter for sterilization: Secondary | ICD-10-CM | POA: Diagnosis not present

## 2021-08-23 NOTE — Progress Notes (Signed)
I connected with  Jenny Carr on 08/23/21 by a video enabled telemedicine application and verified that I am speaking with the correct person using two identifiers.   I discussed the limitations of evaluation and management by telemedicine. The patient expressed understanding and agreed to proceed.   [facility] for Tubal Consult. BTS was signed 07/06/21

## 2021-08-23 NOTE — Progress Notes (Signed)
   TELEHEALTH GYNECOLOGY VISIT ENCOUNTER NOTE  Provider location: Center for [facility]   Patient location: Home  I connected with Jenny Carr on 08/23/21 at  1:30 PM EST by telephone and verified that I am speaking with the correct person using two identifiers. Patient was unable to do MyChart audiovisual encounter due to technical difficulties, she tried several times.    I discussed the limitations, risks, security and privacy concerns of performing an evaluation and management service by telephone and the availability of in person appointments. I also discussed with the patient that there may be a patient responsible charge related to this service. The patient expressed understanding and agreed to proceed.   History:  Jenny Carr is a 35 y.o. G66P3003 female being evaluated today for sterilization consult. She denies any abnormal vaginal discharge, bleeding, pelvic pain or other concerns.    BTL papers signed 07/06/21   Past Medical History:  Diagnosis Date   Gestational hypertension 06/22/2021   BP @NOB  appt 133/83, BP 141/75 on 9/8, BP 141/82 on 9/22   Pregnancy induced hypertension    UTI (urinary tract infection) during pregnancy 12/23/2020   12/23/20>TOC   No past surgical history on file. The following portions of the patient's history were reviewed and updated as appropriate: allergies, current medications, past family history, past medical history, past social history, past surgical history and problem list.   Health Maintenance:  Normal pap and negative HRHPV on 2021.    Review of Systems:  Pertinent items noted in HPI and remainder of comprehensive ROS otherwise negative.  Physical Exam:   General:  Alert, oriented and cooperative.   Mental Status: Normal mood and affect perceived. Normal judgment and thought content.  Physical exam deferred due to nature of the encounter  Labs and Imaging No results found for this or any previous visit  (from the past 336 hour(s)). No results found.    Assessment and Plan:     1. Request for sterilization Medicaid papers signed. 35 y.o. 35 with undesired fertility desires permanent sterilization. Risks and benefits of laparoscopic tubal sterilization procedure was discussed with the patient including permanence of method, bleeding, infection, injury to surrounding organs, anesthesia and need for additional procedures. Risk failure of 0.5-1% with increased risk of ectopic gestation if pregnancy occurs was also discussed with patient. Patient verbalized understanding and all questions were answered.        I discussed the assessment and treatment plan with the patient. The patient was provided an opportunity to ask questions and all were answered. The patient agreed with the plan and demonstrated an understanding of the instructions.   The patient was advised to call back or seek an in-person evaluation/go to the ED if the symptoms worsen or if the condition fails to improve as anticipated.  I provided 10 minutes of non-face-to-face time during this encounter.   [id number], MD Center for [facility], [facility]

## 2021-08-25 ENCOUNTER — Telehealth: Payer: Self-pay

## 2021-08-25 ENCOUNTER — Encounter: Payer: Self-pay | Admitting: Family Medicine

## 2021-08-25 NOTE — Telephone Encounter (Signed)
Called patient to discuss potential surgery dates. Patient opted for 11/29 date.

## 2021-09-28 ENCOUNTER — Encounter (HOSPITAL_BASED_OUTPATIENT_CLINIC_OR_DEPARTMENT_OTHER): Payer: Self-pay | Admitting: Obstetrics & Gynecology

## 2021-10-03 ENCOUNTER — Encounter (HOSPITAL_BASED_OUTPATIENT_CLINIC_OR_DEPARTMENT_OTHER): Payer: Self-pay | Admitting: Anesthesiology

## 2021-10-03 ENCOUNTER — Encounter (HOSPITAL_BASED_OUTPATIENT_CLINIC_OR_DEPARTMENT_OTHER): Payer: Self-pay | Admitting: Obstetrics & Gynecology

## 2021-10-03 ENCOUNTER — Other Ambulatory Visit: Payer: Self-pay

## 2021-10-03 NOTE — Anesthesia Preprocedure Evaluation (Deleted)
Anesthesia Evaluation    Reviewed: Allergy & Precautions, Patient's Chart, lab work & pertinent test results  Airway        Dental   Pulmonary neg pulmonary ROS,           Cardiovascular Exercise Tolerance: Poor      Neuro/Psych negative neurological ROS  negative psych ROS   GI/Hepatic negative GI ROS, Neg liver ROS,   Endo/Other  Morbid obesity (BMI 43.46)  Renal/GU negative Renal ROS     Musculoskeletal negative musculoskeletal ROS (+)   Abdominal (+) + obese,   Peds  Hematology   Anesthesia Other Findings   Reproductive/Obstetrics                             Anesthesia Physical Anesthesia Plan  ASA: 3  Anesthesia Plan: General   Post-op Pain Management: Tylenol PO (pre-op), Dilaudid IV and Toradol IV (intra-op)   Induction:   PONV Risk Score and Plan: 4 or greater and Treatment may vary due to age or medical condition, Midazolam, Ondansetron and Scopolamine patch - Pre-op  Airway Management Planned: Oral ETT  Additional Equipment:   Intra-op Plan:   Post-operative Plan: Extubation in OR  Informed Consent:     Dental advisory given  Plan Discussed with:   Anesthesia Plan Comments: (GA)        Anesthesia Quick Evaluation

## 2021-10-03 NOTE — Progress Notes (Signed)
Spoke w/ via phone for pre-op interview--- pt Lab needs dos---- urine preg (per anes);  pre-op orders pending              Lab results------ no COVID test -----patient states asymptomatic no test needed Arrive at ------- 1215 on 10-04-2021 NPO after MN NO Solid Food.  Clear liquids from MN until--- 1115 Med rec completed Medications to take morning of surgery ----- none Diabetic medication ----- n/a Patient instructed no nail polish to be worn day of surgery Patient instructed to bring photo id and insurance card day of surgery Patient aware to have Driver (ride ) / caregiver for 24 hours after surgery --  Pt verbalized understanding per policy she has to have a responsible person age 33 or > for driver/ caregiver and have name / phone numbers at check-in Patient Special Instructions ----- n/a Pre-Op special Istructions ----- sent inbox message to dr Debroah Loop in epic requested orders Patient verbalized understanding of instructions that were given at this phone interview. Patient denies shortness of breath, chest pain, fever, cough at this phone interview.

## 2021-10-04 ENCOUNTER — Encounter (HOSPITAL_BASED_OUTPATIENT_CLINIC_OR_DEPARTMENT_OTHER): Payer: Self-pay | Admitting: Obstetrics and Gynecology

## 2021-10-04 ENCOUNTER — Ambulatory Visit (HOSPITAL_BASED_OUTPATIENT_CLINIC_OR_DEPARTMENT_OTHER)
Admission: RE | Admit: 2021-10-04 | Payer: Commercial Managed Care - PPO | Source: Home / Self Care | Admitting: Obstetrics and Gynecology

## 2021-10-04 HISTORY — DX: Presence of spectacles and contact lenses: Z97.3

## 2021-10-04 HISTORY — DX: Thalassemia minor: D56.3

## 2021-10-04 SURGERY — LIGATION, FALLOPIAN TUBE, LAPAROSCOPIC
Anesthesia: Choice | Laterality: Bilateral

## 2021-10-04 NOTE — H&P (Signed)
Pt did not show for surgery. She was contacted twice by [facility] leading up to the surgery as well as by preop. On the day of surgery, she was called 3 times. We waited until less than hour after her scheduled surgery time (225pm). She had been instructed via voicemail to arrive at 12:15 for her surgery. At 1:35, the surgery was cancelled.    Milas Hock 10/04/2021, 12:16 PM

## 2021-10-11 NOTE — Progress Notes (Signed)
Vitals were not recorded during this visit.  Clovis Pu, RN

## 2021-10-15 NOTE — L&D Delivery Note (Signed)
Delivery Note At 1:50 AM a viable and healthy female was delivered via  (Presentation:   Left Occiput Anterior).  APGAR: 9, 9; weight  pending.   Placenta status: Spontaneous, Intact.  Cord: 3 vessel  with the following complications:  none.    Anesthesia: None Episiotomy: None Lacerations: 1st degree perineal, hemostatic, not repaired Suture Repair:  n/a Est. Blood Loss (mL): 302  Mom to postpartum.  Baby to Couplet care / Skin to Skin.  Jenny WASHINTON is a 36 y.o. female [phone] with IUP at [redacted]w[redacted]d admitted for IOL for low normal amniotic fluid.  She progressed with augmentation to complete and pushed through 2 contractions to deliver.  Cord clamping delayed by 1 minute then clamped by CNM and cut by FOB.  Placenta intact and spontaneous, bleeding minimal.  Small first degree laceration hemostatic and well approximated, not repaired.  Mom and baby stable prior to transfer to postpartum. She plans on breastfeeding. She requests BTL for birth control.   Misty Stanley Leftwich-Kirby 06/23/2022, 2:16 AM

## 2022-02-19 IMAGING — US US MFM OB FOLLOW-UP
1 series · 13 of 28 positions shown · non-contrast
Comparison: none

[Series 1: us mfm ob follow-up · 13 of 39 slices shown]
[im 2/39]
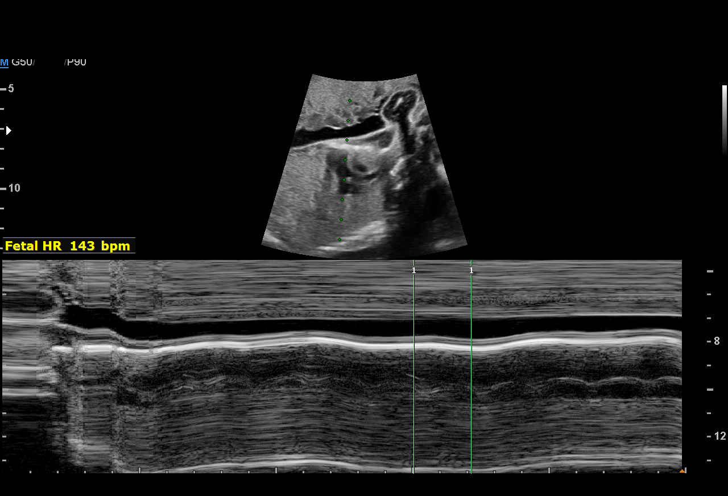
[im 5/39]
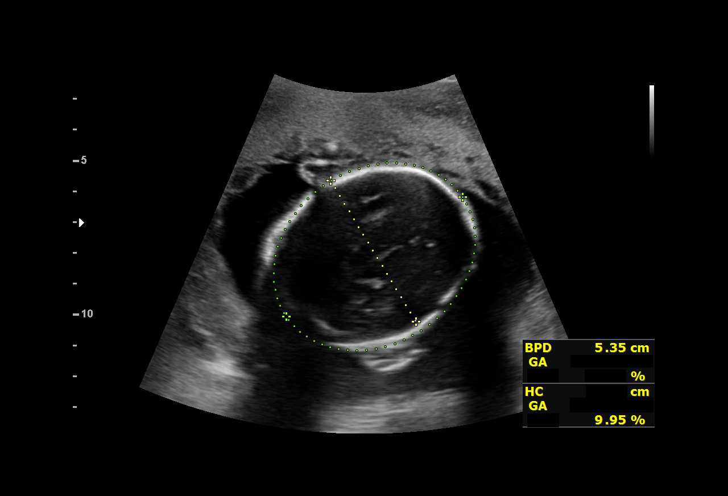
[im 8/39]
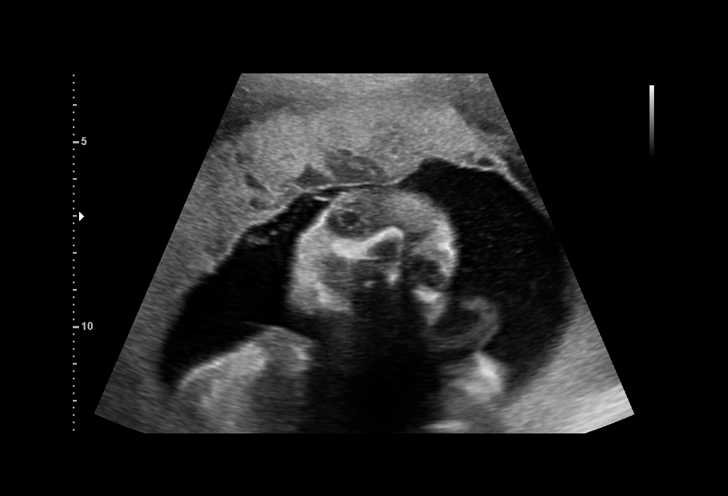
[im 10/39]
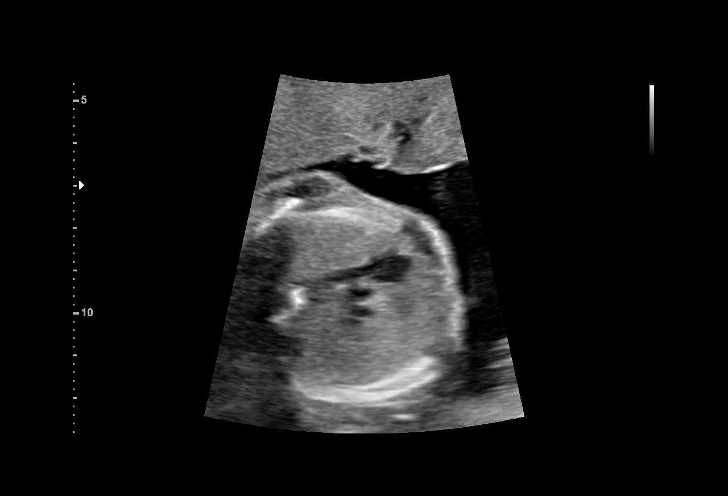
[im 13/39]
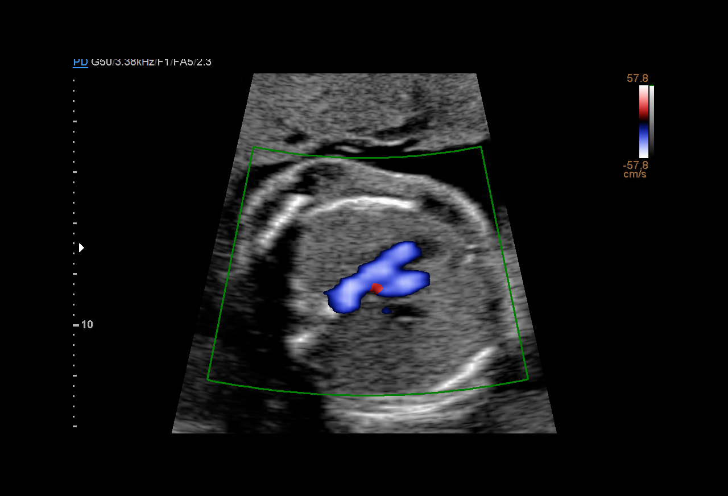
[im 16/39]
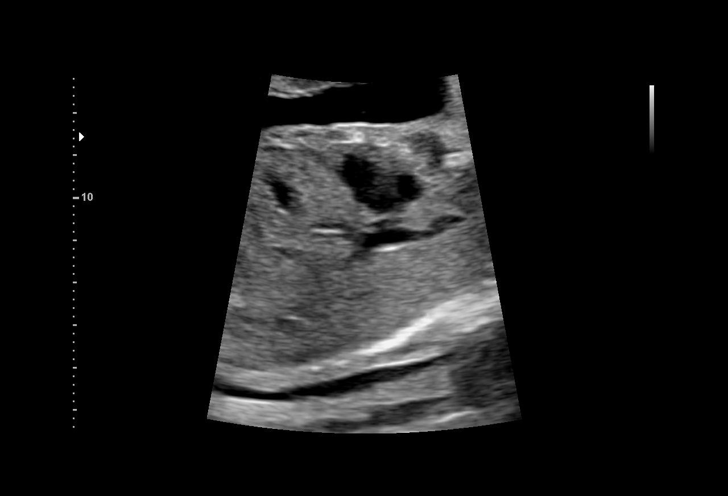
[im 20/39]
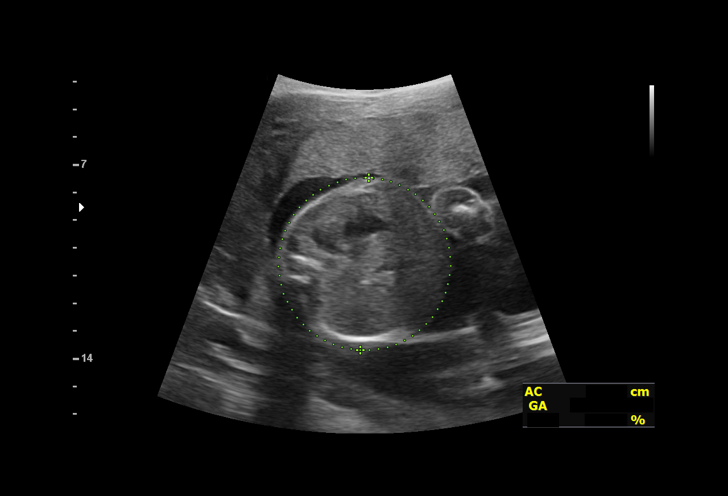
[im 23/39]
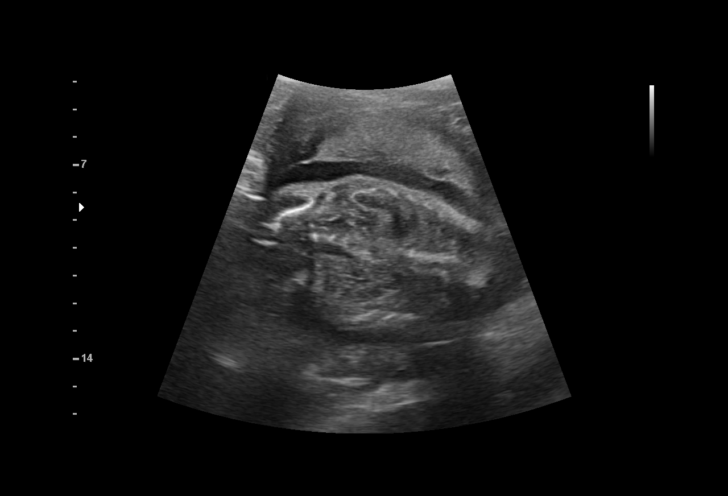
[im 26/39]
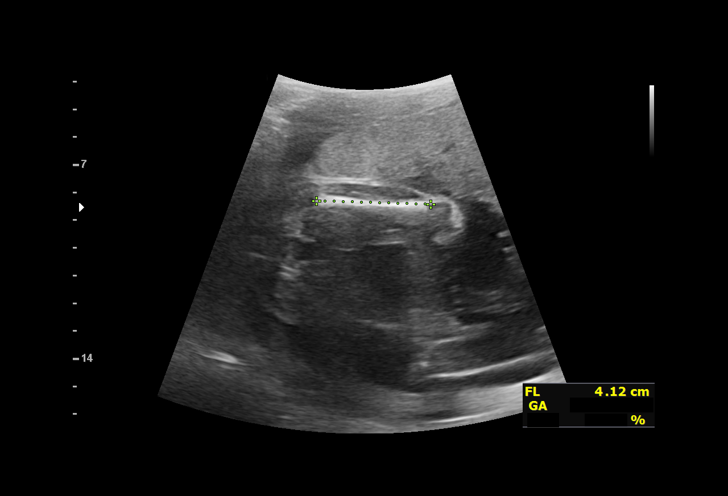
[im 29/39]
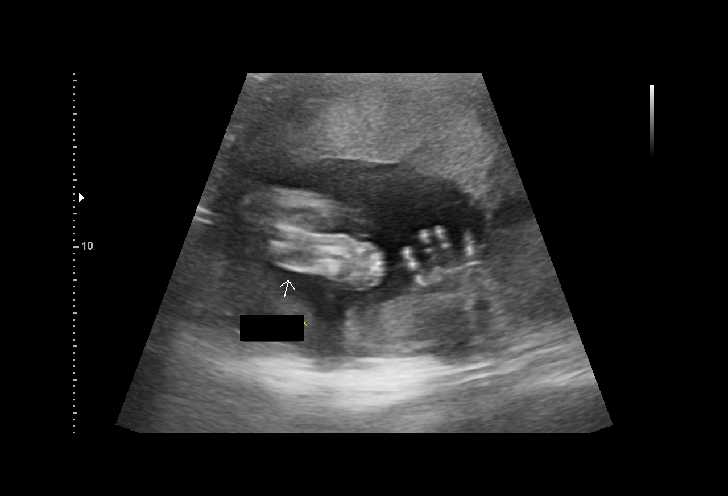
[im 31/39]
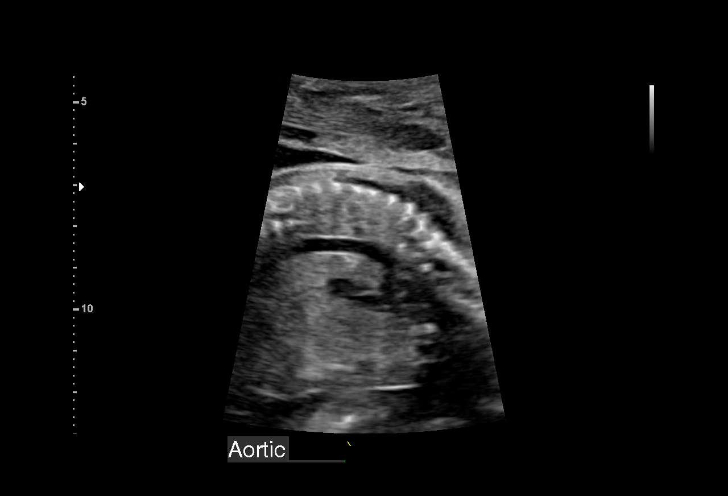
[im 34/39]
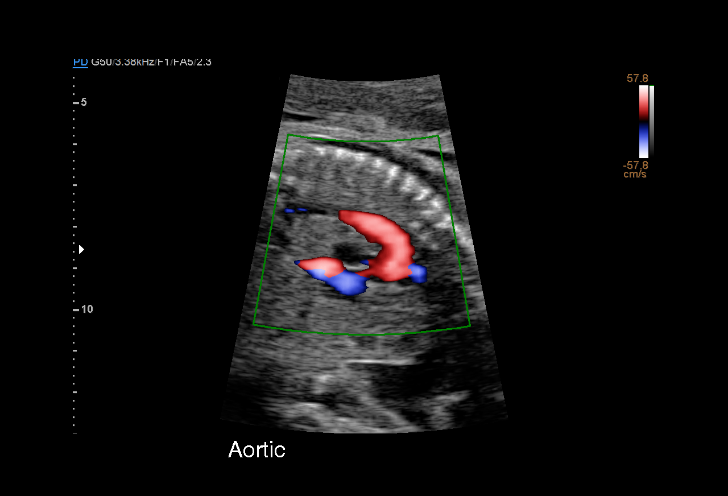
[im 37/39]
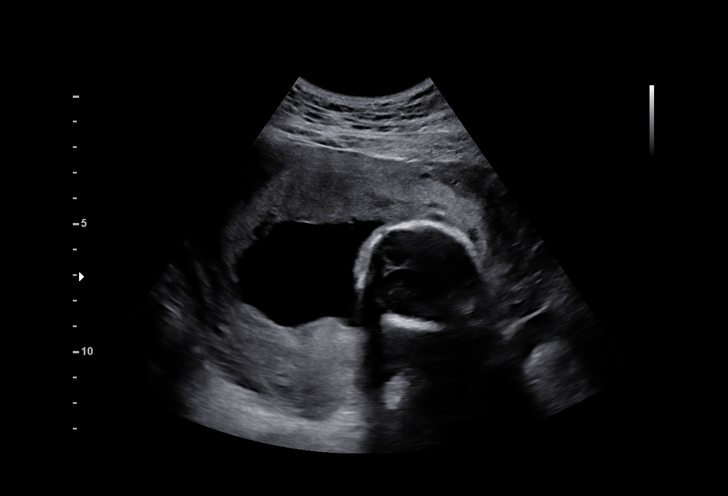

[13 of 28 positions shown; findings below may reference images not displayed]

Indications

 Obesity complicating pregnancy, second
 trimester (BMI 43.)
 22 weeks gestation of pregnancy
 Genetic carrier (specify) (Jeanna Golding silent
 carrier and SMA carrier)
Fetal Evaluation

 Num Of Fetuses:         1
 Fetal Heart Rate(bpm):  143
 Cardiac Activity:       Observed
 Presentation:           Cephalic
 Placenta:               Anterior
 P. Cord Insertion:      Previously Visualized

 Amniotic Fluid
 AFI FV:      Within normal limits

                             Largest Pocket(cm)

Biometry

 BPD:      53.7  mm     G. Age:  22w 2d         26  %    CI:        70.32   %    70 - 86
                                                         FL/HC:      20.4   %    19.2 -
 HC:      204.2  mm     G. Age:  22w 4d         24  %    HC/AC:      1.06        1.05 -
 AC:      192.3  mm     G. Age:  24w 0d         76  %    FL/BPD:     77.5   %    71 - 87
 FL:       41.6  mm     G. Age:  23w 4d         61  %    FL/AC:      21.6   %    20 - 24

 LV:        3.9  mm

 Est. FW:     608  gm      1 lb 5 oz     78  %
OB History

 Blood Type:   A+
 Gravidity:    3         Term:   2        Prem:   0        SAB:   0
 TOP:          0       Ectopic:  0
Gestational Age

 LMP:           27w 6d        Date:  09/20/20                 EDD:   06/27/21
 U/S Today:     23w 1d                                        EDD:   07/30/21
 Best:          22w 6d     Det. By:  Early Ultrasound         EDD:   08/01/21
                                     (12/28/20)
Anatomy

 Cranium:               Appears normal         Aortic Arch:            Appears normal
 Cavum:                 Previously seen        Ductal Arch:            Previously seen
 Ventricles:            Appears normal         Diaphragm:              Previously seen
 Choroid Plexus:        Previously seen        Stomach:                Appears normal, left
                                                                       sided
 Cerebellum:            Previously seen        Abdomen:                Previously seen
 Posterior Fossa:       Previously seen        Abdominal Wall:         Previously seen
 Nuchal Fold:           Previously seen        Cord Vessels:           Previously seen
 Face:                  Orbits and profile     Kidneys:                Appear normal
                        previously seen
 Lips:                  Appears normal         Bladder:                Appears normal
 Thoracic:              Previously seen        Spine:                  Previously seen
 Heart:                 Appears normal         Upper Extremities:      Previously seen
                        (4CH, axis, and
                        situs)
 RVOT:                  Previously seen        Lower Extremities:      Previously seen
 LVOT:                  Previously seen

 Other:  Female gender previously seen. Heels and lenses previously
         visualized. VC, 3VV and 3VTV visualized.
Cervix Uterus Adnexa

 Cervix
 Length:           4.18  cm.
 Normal appearance by transabdominal scan.

 Right Ovary
 Visualized.

 Left Ovary
 Visualized.
Comments

 This patient was seen for a follow up growth scan due to
 maternal obesity with a BMI of 43.  She denies any problems
 since her last exam.
 She was informed that the fetal growth and amniotic fluid
 level appears appropriate for her gestational age.
 The aortic arch was visualized today and appeared within
 normal limits.  The limitations of ultrasound in the detection of
 all anomalies was discussed.
 Due to maternal obesity with a BMI of greater than 40, weekly
 fetal testing should be started at around 36 weeks.
 A follow-up growth scan was scheduled in 5 weeks.

## 2022-04-18 ENCOUNTER — Ambulatory Visit (INDEPENDENT_AMBULATORY_CARE_PROVIDER_SITE_OTHER): Payer: No Typology Code available for payment source

## 2022-04-18 ENCOUNTER — Other Ambulatory Visit: Payer: Self-pay

## 2022-04-18 DIAGNOSIS — Z3201 Encounter for pregnancy test, result positive: Secondary | ICD-10-CM | POA: Diagnosis not present

## 2022-04-18 DIAGNOSIS — Z3687 Encounter for antenatal screening for uncertain dates: Secondary | ICD-10-CM

## 2022-04-18 LAB — POCT PREGNANCY, URINE: Preg Test, Ur: POSITIVE — AB

## 2022-04-18 NOTE — Progress Notes (Signed)
Pt left urine for UPT resulting positive.  Attempted to call pt unable to leave message due to phone going straight to voicemail and voicemail box being full.   Pt returned call @ 1045 and pt given positive results of UPT.  Pt reports that she is unsure of LMP because she has irregular periods.  Pt denies vaginal bleeding but is reporting that she is feeling movement and feels that she sees the pregnant line on her abdomen.  Medications/allergies reviewed.  Due to pt being unsure of dating U/S scheduled for July 18th @ 1545.  Pt provided with appt and reasons to go to MAU.  Pt verbalized understanding with no further questions.    Leonette Nutting  04/18/22

## 2022-05-01 ENCOUNTER — Telehealth: Payer: Self-pay

## 2022-05-01 ENCOUNTER — Ambulatory Visit (HOSPITAL_BASED_OUTPATIENT_CLINIC_OR_DEPARTMENT_OTHER): Payer: No Typology Code available for payment source

## 2022-05-01 ENCOUNTER — Other Ambulatory Visit: Payer: Self-pay | Admitting: Family Medicine

## 2022-05-01 ENCOUNTER — Ambulatory Visit
Admission: RE | Admit: 2022-05-01 | Discharge: 2022-05-01 | Disposition: A | Discharge status: Home / Self Care | Payer: No Typology Code available for payment source | Source: Ambulatory Visit | Attending: Family Medicine | Admitting: Family Medicine

## 2022-05-01 DIAGNOSIS — O09523 Supervision of elderly multigravida, third trimester: Secondary | ICD-10-CM | POA: Insufficient documentation

## 2022-05-01 DIAGNOSIS — Z3492 Encounter for supervision of normal pregnancy, unspecified, second trimester: Secondary | ICD-10-CM

## 2022-05-01 DIAGNOSIS — Z363 Encounter for antenatal screening for malformations: Secondary | ICD-10-CM | POA: Diagnosis not present

## 2022-05-01 DIAGNOSIS — Z3687 Encounter for antenatal screening for uncertain dates: Secondary | ICD-10-CM | POA: Insufficient documentation

## 2022-05-01 DIAGNOSIS — Z3A31 31 weeks gestation of pregnancy: Secondary | ICD-10-CM | POA: Insufficient documentation

## 2022-05-02 ENCOUNTER — Encounter: Payer: Self-pay | Admitting: Family Medicine

## 2022-05-02 ENCOUNTER — Other Ambulatory Visit: Payer: Self-pay | Admitting: *Deleted

## 2022-05-02 DIAGNOSIS — O09523 Supervision of elderly multigravida, third trimester: Secondary | ICD-10-CM

## 2022-05-02 DIAGNOSIS — Z3493 Encounter for supervision of normal pregnancy, unspecified, third trimester: Secondary | ICD-10-CM

## 2022-05-02 DIAGNOSIS — Z3480 Encounter for supervision of other normal pregnancy, unspecified trimester: Secondary | ICD-10-CM | POA: Insufficient documentation

## 2022-05-21 ENCOUNTER — Other Ambulatory Visit (HOSPITAL_COMMUNITY)
Admission: RE | Admit: 2022-05-21 | Discharge: 2022-05-21 | Disposition: A | Discharge status: Home / Self Care | Payer: No Typology Code available for payment source | Source: Ambulatory Visit | Attending: Obstetrics and Gynecology | Admitting: Obstetrics and Gynecology

## 2022-05-21 ENCOUNTER — Other Ambulatory Visit: Payer: Self-pay

## 2022-05-21 ENCOUNTER — Ambulatory Visit (INDEPENDENT_AMBULATORY_CARE_PROVIDER_SITE_OTHER): Payer: No Typology Code available for payment source | Admitting: Obstetrics and Gynecology

## 2022-05-21 ENCOUNTER — Encounter: Payer: Self-pay | Admitting: Obstetrics and Gynecology

## 2022-05-21 VITALS — BP 120/69 | HR 84 | Wt 251.0 lb

## 2022-05-21 DIAGNOSIS — O09899 Supervision of other high risk pregnancies, unspecified trimester: Secondary | ICD-10-CM

## 2022-05-21 DIAGNOSIS — O99213 Obesity complicating pregnancy, third trimester: Secondary | ICD-10-CM | POA: Diagnosis not present

## 2022-05-21 DIAGNOSIS — O09893 Supervision of other high risk pregnancies, third trimester: Secondary | ICD-10-CM | POA: Diagnosis not present

## 2022-05-21 DIAGNOSIS — O0933 Supervision of pregnancy with insufficient antenatal care, third trimester: Secondary | ICD-10-CM

## 2022-05-21 DIAGNOSIS — Z8759 Personal history of other complications of pregnancy, childbirth and the puerperium: Secondary | ICD-10-CM

## 2022-05-21 DIAGNOSIS — D563 Thalassemia minor: Secondary | ICD-10-CM

## 2022-05-21 DIAGNOSIS — Z3480 Encounter for supervision of other normal pregnancy, unspecified trimester: Secondary | ICD-10-CM | POA: Insufficient documentation

## 2022-05-21 DIAGNOSIS — Z3483 Encounter for supervision of other normal pregnancy, third trimester: Secondary | ICD-10-CM | POA: Diagnosis not present

## 2022-05-21 DIAGNOSIS — Z23 Encounter for immunization: Secondary | ICD-10-CM

## 2022-05-21 DIAGNOSIS — Z6841 Body Mass Index (BMI) 40.0 and over, adult: Secondary | ICD-10-CM | POA: Diagnosis not present

## 2022-05-21 DIAGNOSIS — Z3A34 34 weeks gestation of pregnancy: Secondary | ICD-10-CM

## 2022-05-21 DIAGNOSIS — O99210 Obesity complicating pregnancy, unspecified trimester: Secondary | ICD-10-CM

## 2022-05-21 DIAGNOSIS — Z148 Genetic carrier of other disease: Secondary | ICD-10-CM

## 2022-05-21 NOTE — Progress Notes (Unsigned)
Patient reports occasional braxton hicks along with vaginal pressure, denies any LOF or vaginal bleeding    Tdap vaccine administered into left deltoid without any complications. Patient had no questions or concerns.   Dawayne Patricia, CMA   05/21/22

## 2022-05-22 LAB — CBC/D/PLT+RPR+RH+ABO+RUBIGG...
Antibody Screen: NEGATIVE
Basophils Absolute: 0 10*3/uL (ref 0.0–0.2)
Basos: 0 %
EOS (ABSOLUTE): 0.1 10*3/uL (ref 0.0–0.4)
Eos: 1 %
HCV Ab: NONREACTIVE
HIV Screen 4th Generation wRfx: NONREACTIVE
Hematocrit: 31.4 % — ABNORMAL LOW (ref 34.0–46.6)
Hemoglobin: 10.2 g/dL — ABNORMAL LOW (ref 11.1–15.9)
Hepatitis B Surface Ag: NEGATIVE
Immature Grans (Abs): 0.1 10*3/uL (ref 0.0–0.1)
Immature Granulocytes: 1 %
Lymphocytes Absolute: 1.3 10*3/uL (ref 0.7–3.1)
Lymphs: 16 %
MCH: 23.6 pg — ABNORMAL LOW (ref 26.6–33.0)
MCHC: 32.5 g/dL (ref 31.5–35.7)
MCV: 73 fL — ABNORMAL LOW (ref 79–97)
Monocytes Absolute: 0.6 10*3/uL (ref 0.1–0.9)
Monocytes: 8 %
Neutrophils Absolute: 5.8 10*3/uL (ref 1.4–7.0)
Neutrophils: 74 %
Platelets: 281 10*3/uL (ref 150–450)
RBC: 4.32 x10E6/uL (ref 3.77–5.28)
RDW: 14.5 % (ref 11.7–15.4)
RPR Ser Ql: NONREACTIVE
Rh Factor: POSITIVE
Rubella Antibodies, IGG: 6.03 index (ref >0.99)
WBC: 8 10*3/uL (ref 3.4–10.8)

## 2022-05-22 LAB — COMPREHENSIVE METABOLIC PANEL WITH GFR
ALT: 4 IU/L (ref 0–32)
AST: 11 IU/L (ref 0–40)
Albumin/Globulin Ratio: 1.4 (ref 1.2–2.2)
Albumin: 3.8 g/dL — ABNORMAL LOW (ref 3.9–4.9)
Alkaline Phosphatase: 138 IU/L — ABNORMAL HIGH (ref 44–121)
BUN/Creatinine Ratio: 11 (ref 9–23)
BUN: 5 mg/dL — ABNORMAL LOW (ref 6–20)
Bilirubin Total: 0.3 mg/dL (ref 0.0–1.2)
CO2: 21 mmol/L (ref 20–29)
Calcium: 9.3 mg/dL (ref 8.7–10.2)
Chloride: 99 mmol/L (ref 96–106)
Creatinine, Ser: 0.47 mg/dL — ABNORMAL LOW (ref 0.57–1.00)
Globulin, Total: 2.7 g/dL (ref 1.5–4.5)
Glucose: 73 mg/dL (ref 70–99)
Potassium: 3.7 mmol/L (ref 3.5–5.2)
Sodium: 134 mmol/L (ref 134–144)
Total Protein: 6.5 g/dL (ref 6.0–8.5)
eGFR: 128 mL/min/1.73

## 2022-05-22 LAB — CERVICOVAGINAL ANCILLARY ONLY
Chlamydia: NEGATIVE
Comment: NEGATIVE
Comment: NEGATIVE
Comment: NORMAL
Neisseria Gonorrhea: NEGATIVE
Trichomonas: NEGATIVE

## 2022-05-22 LAB — PROTEIN / CREATININE RATIO, URINE
Creatinine, Urine: 203.4 mg/dL
Protein, Ur: 36.7 mg/dL
Protein/Creat Ratio: 180 mg/g{creat} (ref 0–200)

## 2022-05-22 LAB — HCV INTERPRETATION

## 2022-05-22 LAB — TSH: TSH: 1.69 u[IU]/mL (ref 0.450–4.500)

## 2022-05-22 LAB — HEMOGLOBIN A1C
Est. average glucose Bld gHb Est-mCnc: 117 mg/dL
Hgb A1c MFr Bld: 5.7 % — ABNORMAL HIGH (ref 4.8–5.6)

## 2022-05-22 LAB — GLUCOSE TOLERANCE, 1 HOUR: Glucose, 1Hr PP: 125 mg/dL (ref 70–199)

## 2022-05-23 LAB — URINE CULTURE, OB REFLEX

## 2022-05-23 LAB — CULTURE, OB URINE

## 2022-05-23 NOTE — Progress Notes (Signed)
New OB Note  05/21/2022   Clinic: Center for [facility]  Chief Complaint: new OB  Transfer of Care Patient: no  History of Present Illness: Ms. Jenny Carr is a 36 y.o. G4P3003 @ 34/1 weeks (EDC 9/17, based on 31wk u/s).  Preg complicated by has Obesity (BMI 30-39.9); Obesity in pregnancy; Late prenatal care affecting pregnancy in third trimester; Alpha thalassemia silent carrier; Genetic carrier; Supervision of other normal pregnancy, antepartum; Short interval between pregnancies affecting pregnancy, antepartum; and History of gestational hypertension on their problem list.   Patient denies any labor or decreased FM  Patient with irregular periods and didn't realize she was pregnant until recently.   ROS: A 12-point review of systems was performed and negative, except as stated in the above HPI.  OBGYN History: As per HPI. OB History  Gravida Para Term Preterm AB Living  4 3 3     3   SAB IAB Ectopic Multiple Live Births        0 3    # Outcome Date GA Lbr Len/2nd Weight Sex Delivery Anes PTL Lv  4 Current           3 Term 07/16/21 [redacted]w[redacted]d 07:55 / 00:12 7 lb 1.6 oz (3.22 kg) F Vag-Spont EPI  LIV  2 Term 09/18/15 [redacted]w[redacted]d 04:47 / 00:27 8 lb 4.3 oz (3.751 kg) F Vag-Spont EPI  LIV  1 Term 04/30/11 [redacted]w[redacted]d 14:32 / 00:56 6 lb 4 oz (2.835 kg) M Vag-Spont EPI  LIV     Past Medical History: Past Medical History:  Diagnosis Date   Alpha thalassemia silent carrier    History of gestational hypertension 06/22/2021   BP @NOB  appt 133/83, BP 141/75 on 9/8, BP 141/82 on 9/22   Wears glasses     Past Surgical History: No past surgical history on file.  Family History:  Family History  Problem Relation Age of Onset   Cancer Father     Social History:  Social History   Socioeconomic History   Marital status: Single    Spouse name: Not on file   Number of children: 2   Years of education: Not on file   Highest education level: Bachelor's degree (e.g., BA, AB,  BS)  Occupational History   Occupation: customer service rep    Employer: [employer]  Tobacco Use   Smoking status: Never   Smokeless tobacco: Never  Vaping Use   Vaping Use: Never used  Substance and Sexual Activity   Alcohol use: Not Currently    Comment: social   Drug use: Never   Sexual activity: Yes    Birth control/protection: None    Comment: SVD  07-16-2021  Other Topics Concern   Not on file  Social History Narrative   Not on file   Social Determinants of Health   Financial Resource Strain: Low Risk  (12/20/2020)   Overall Financial Resource Strain (CARDIA)    Difficulty of Paying Living Expenses: Not hard at all  Food Insecurity: No Food Insecurity (12/20/2020)   Hunger Vital Sign    Worried About Running Out of Food in the Last Year: Never true    Ran Out of Food in the Last Year: Never true  Transportation Needs: No Transportation Needs (12/20/2020)   PRAPARE - 02/19/2021 (Medical): No    Lack of Transportation (Non-Medical): No  Physical Activity: Inactive (12/20/2020)   Exercise Vital Sign    Days of Exercise per Week: 0 days  Minutes of Exercise per Session: 0 min  Stress: No Stress Concern Present (12/20/2020)   [employer] of Occupational Health - Occupational Stress Questionnaire    Feeling of Stress : Not at all  Social Connections: Not on file  Intimate Partner Violence: Not At Risk (12/20/2020)   Humiliation, Afraid, Rape, and Kick questionnaire    Fear of Current or Ex-Partner: No    Emotionally Abused: No    Physically Abused: No    Sexually Abused: No   Allergy: No Known Allergies  Current Outpatient Medications: Prenatal vitamin  Physical Exam:   BP 120/69   Pulse 84   Wt 251 lb (113.9 kg)   LMP 09/26/2021 (Approximate) Comment: SVD 07-16-2021  BMI 47.43 kg/m  Body mass index is 47.43 kg/m. Contractions: Irritability Vag. Bleeding: None. Fundal height:  34 FHTs: 140s  General appearance:  Well nourished, well developed female in no acute distress.  Cardiovascular: S1, S2 normal, no murmur, rub or gallop, regular rate and rhythm Respiratory:  Clear to auscultation bilateral. Normal respiratory effort Abdomen: gravid, nttp Neuro/Psych:  Normal mood and affect.  Skin:  Warm and dry.   Laboratory: reviewed  Imaging:  Neg 7/18 anatomy u/s but with some anatomy not well visuzlied, [facility] 9/17  Assessment: pt doing well  Plan: 1. Supervision of other normal pregnancy, antepartum Btl papers signed today. Pt missed pp l/s BTL b/c household was sick and she was told to cancel it by pre op - CBC/D/Plt+RPR+Rh+ABO+RubIgG... - Hemoglobin A1c - Culture, OB Urine - Cervicovaginal ancillary only( [place]) - TSH - Protein / creatinine ratio, urine - Comprehensive metabolic panel - Panorama Prenatal Test Full Panel - Tdap vaccine greater than or equal to 7yo IM - Glucose Tolerance, 1 Hour  2. Short interval between pregnancies affecting pregnancy, antepartum  3. BMI 45.0-49.9, adult (HCC)  4. Obesity in pregnancy  5. History of gestational hypertension Too late for asa  Problem list reviewed and updated.  Follow up in 3 weeks.  The nature of [facility] with multiple MDs and other Advanced Practice Providers was explained to patient; also emphasized that residents, students are part of our team.  >50% of 35 min visit spent on counseling and coordination of care.     Cornelia Copa MD Attending Center for [facility])

## 2022-05-24 ENCOUNTER — Encounter: Payer: Self-pay | Admitting: *Deleted

## 2022-05-24 MED ORDER — FERROUS SULFATE 325 (65 FE) MG PO TABS: 325.0000 mg | ORAL_TABLET | ORAL | 0 refills | Status: AC

## 2022-05-24 NOTE — Addendum Note (Signed)
Addended by:  Bing on: 05/24/2022 08:29 AM   Modules accepted: Orders

## 2022-05-27 ENCOUNTER — Encounter: Payer: Self-pay | Admitting: Radiology

## 2022-05-29 IMAGING — US US MFM FETAL BPP W/O NON-STRESS
1 series · 14 of 28 positions shown · non-contrast
Comparison: none

[Series 1: us mfm fetal bpp w/o non-stress · 40 acquisitions, 14 frames shown]
[im 2/40]
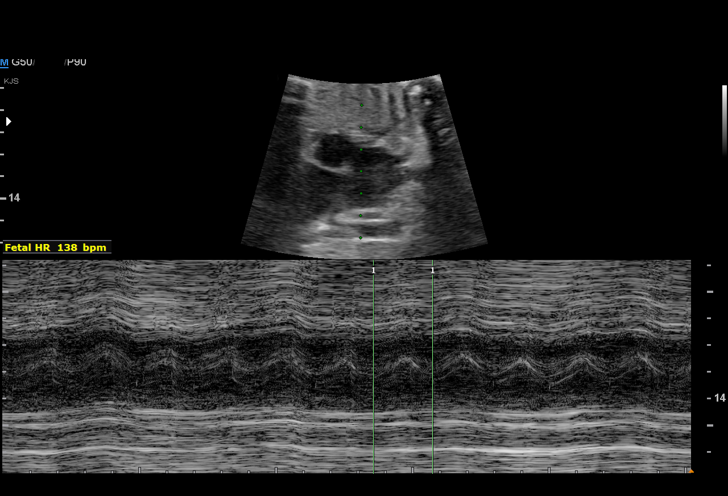
[im 5/40]
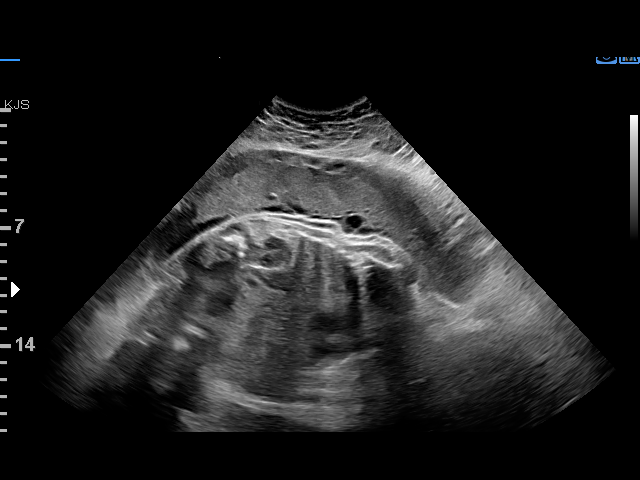
[im 8/40]
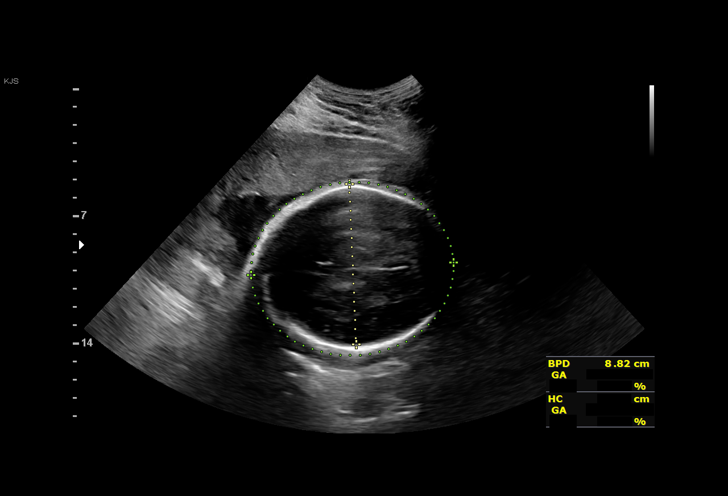
[im 11/40]
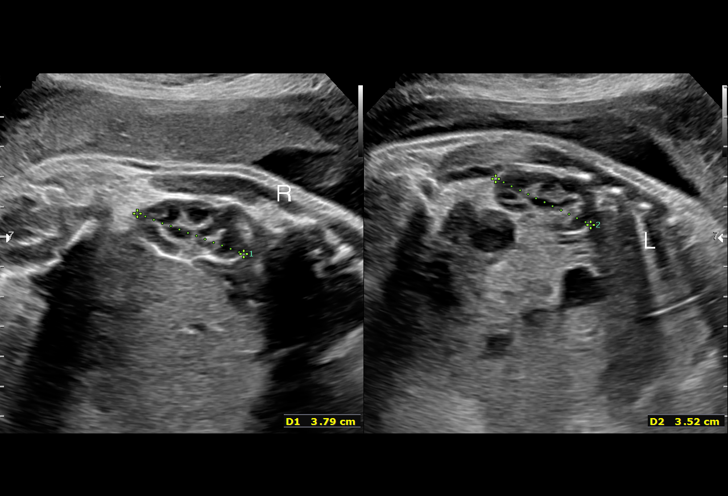
[im 14/40]
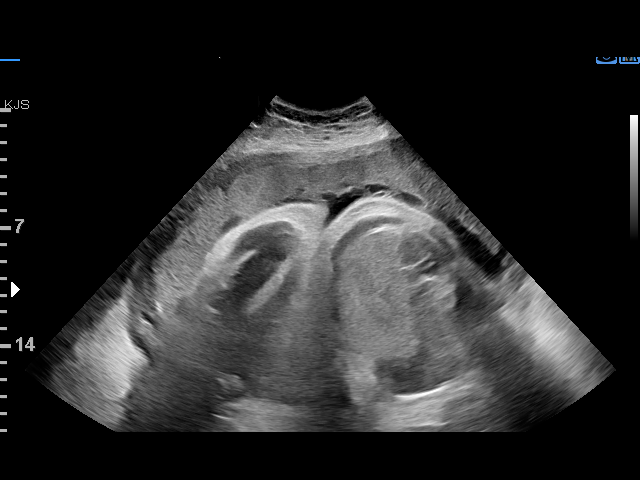
[im 16/40]
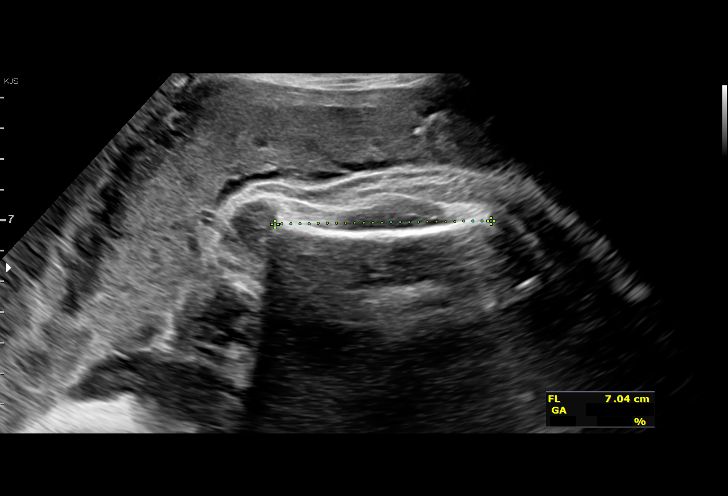
[im 19/40]
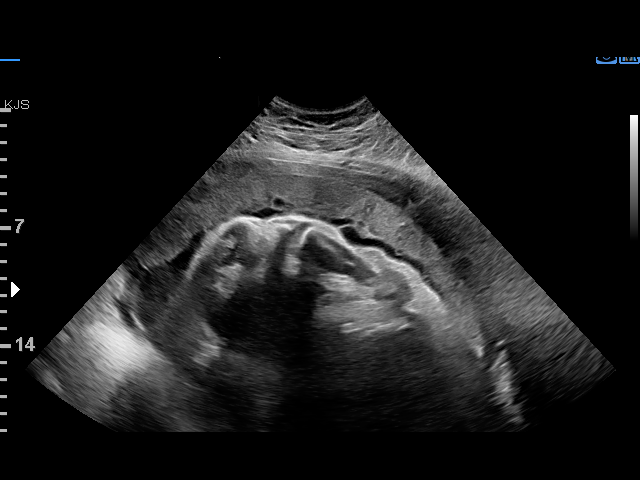
[im 22/40]
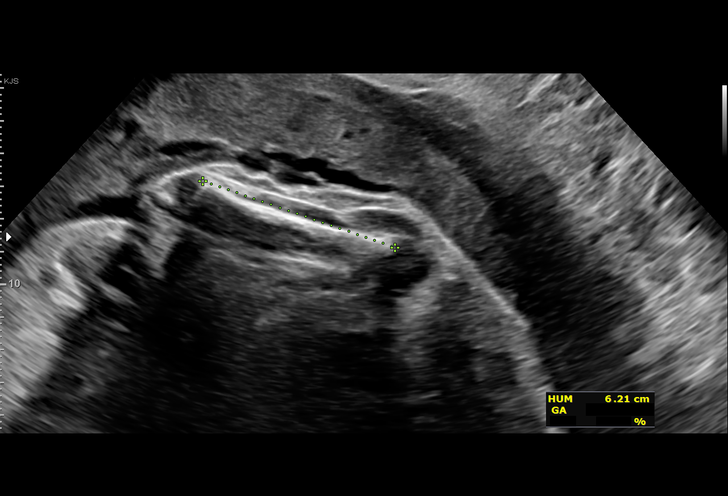
[im 25/40]
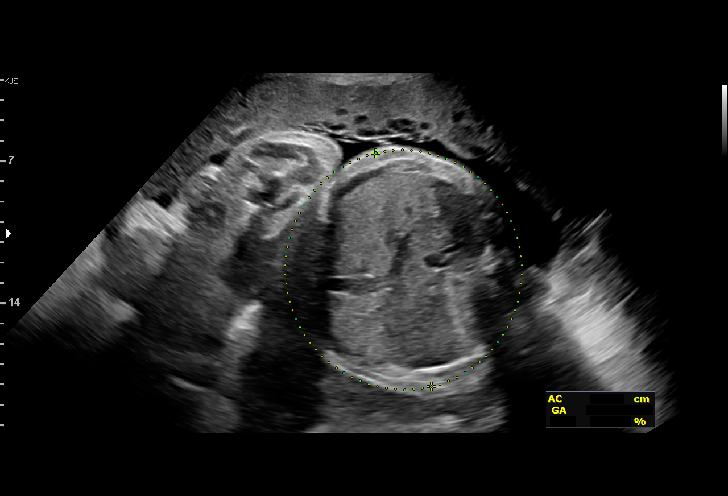
[im 28/40]
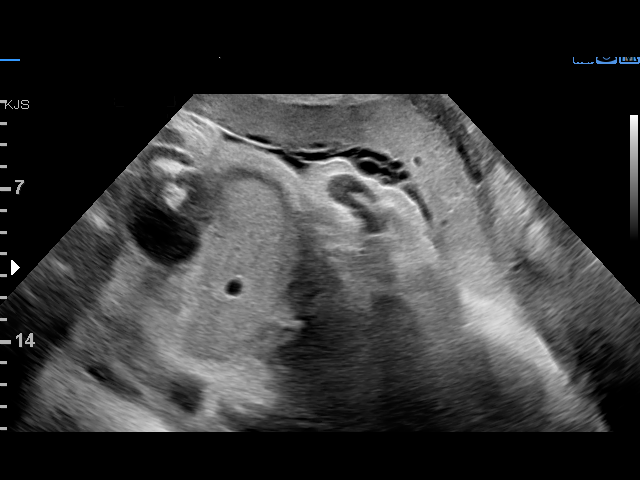
[im 31/40]
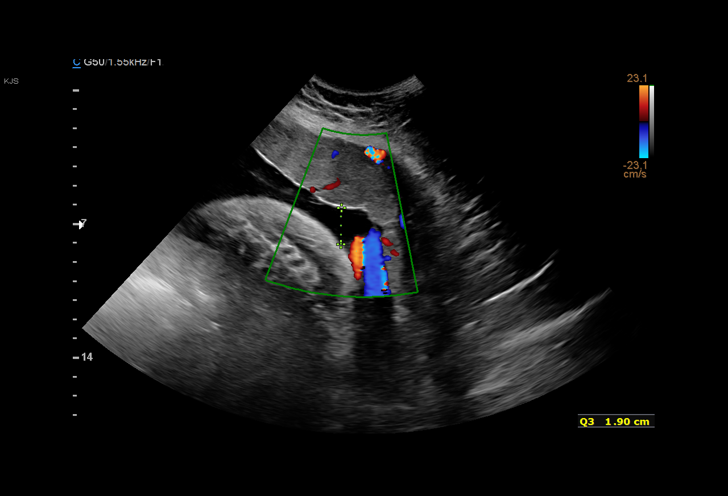
[im 34/40]
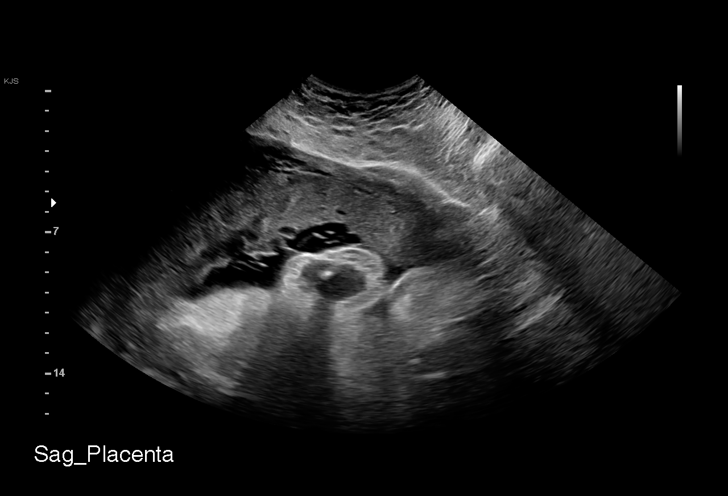
[im 37/40]
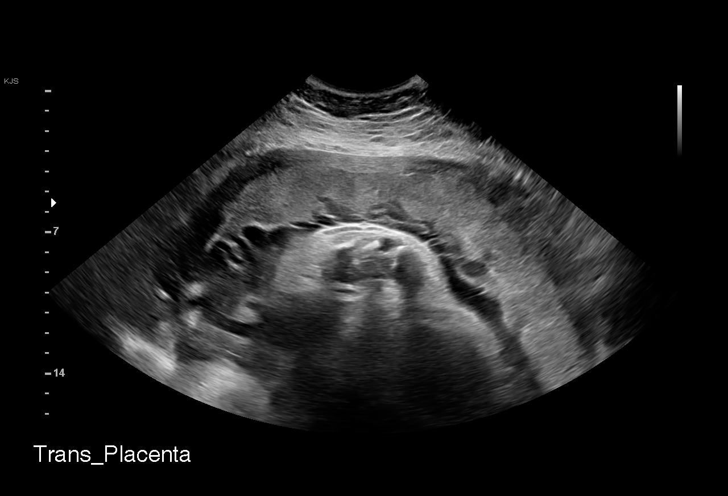
[im 40/40]
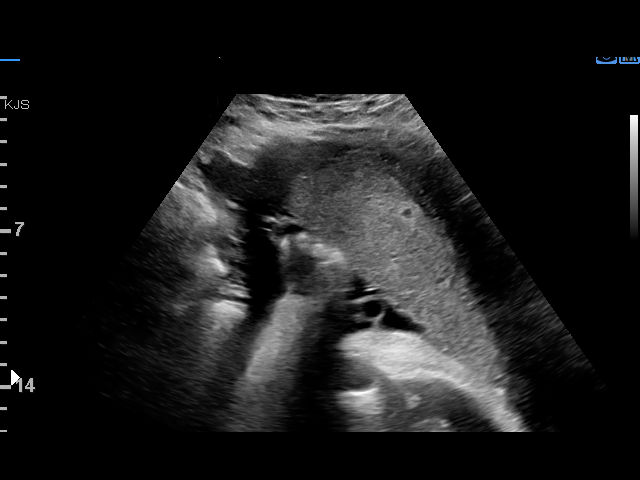

[14 of 28 positions shown; findings below may reference images not displayed]

Indications

 37 weeks gestation of pregnancy
 Hypertension - Gestational
 Obesity complicating pregnancy, second
 trimester (BMI 43.)
 Genetic carrier (specify) (Adenyo Ologo silent
 carrier and SMA carrier)
Fetal Evaluation

 Num Of Fetuses:         1
 Fetal Heart Rate(bpm):  138
 Cardiac Activity:       Observed
 Presentation:           Cephalic
 Placenta:               Anterior
 P. Cord Insertion:      Previously Visualized

 Amniotic Fluid
 AFI FV:      Within normal limits

 AFI Sum(cm)     %Tile       Largest Pocket(cm)
 10.39           27

 RUQ(cm)       RLQ(cm)       LUQ(cm)        LLQ(cm)

Biophysical Evaluation
 Amniotic F.V:   Pocket => 2 cm             F. Tone:        Observed
 F. Movement:    Observed                   Score:          [DATE]
 F. Breathing:   Observed
Biometry

 BPD:      88.3  mm     G. Age:  35w 5d         29  %    CI:        74.11   %    70 - 86
                                                         FL/HC:      21.6   %    20.8 -
 HC:      325.7  mm     G. Age:  36w 6d         22  %    HC/AC:      0.89        0.92 -
 AC:      365.2  mm     G. Age:  40w 3d       > 99  %    FL/BPD:     79.5   %    71 - 87
 FL:       70.2  mm     G. Age:  36w 0d         24  %    FL/AC:      19.2   %    20 - 24
 HUM:      61.8  mm     G. Age:  35w 5d         48  %

 Est. FW:    1239  gm    7 lb 12 oz      89  %
OB History

 Blood Type:   A+
 Gravidity:    3         Term:   2        Prem:   0        SAB:   0
 TOP:          0       Ectopic:  0
Gestational Age

 LMP:           42w 0d        Date:  09/20/20                 EDD:   06/27/21
 U/S Today:     37w 2d                                        EDD:   07/30/21
 Best:          37w 0d     Det. By:  Early Ultrasound         EDD:   08/01/21
                                     (12/28/20)
Anatomy

 Cranium:               Appears normal         Kidneys:                Appear normal
 Diaphragm:             Appears normal         Bladder:                Appears normal
 Stomach:               Appears normal, left
                        sided
Cervix Uterus Adnexa

 Cervix
 Not visualized (advanced GA >89wks)
Impression

 Gestational hypertension.  Blood pressure today at her office
 is 117/64 mmHg.  Patient does not have symptoms and signs
 of severe features of preeclampsia.

 Amniotic fluid is normal good fetal activity seen.Fetal growth
 is appropriate for gestational age .Antenatal testing is
 reassuring. BPP [DATE].

 Patient reports she will be undergoing induction of labor on
 07/16/2021.  She has blood pressure cuff at home and is
 educated on the normal parameters.
Recommendations

 -No follow-up appointments were made .
                 Tiger, Mofidul

## 2022-05-31 ENCOUNTER — Ambulatory Visit: Payer: No Typology Code available for payment source | Admitting: *Deleted

## 2022-05-31 ENCOUNTER — Ambulatory Visit: Payer: No Typology Code available for payment source | Attending: Maternal & Fetal Medicine

## 2022-05-31 ENCOUNTER — Other Ambulatory Visit: Payer: Self-pay | Admitting: Obstetrics

## 2022-05-31 VITALS — BP 122/62 | HR 94

## 2022-05-31 DIAGNOSIS — Z3493 Encounter for supervision of normal pregnancy, unspecified, third trimester: Secondary | ICD-10-CM | POA: Insufficient documentation

## 2022-05-31 DIAGNOSIS — O09293 Supervision of pregnancy with other poor reproductive or obstetric history, third trimester: Secondary | ICD-10-CM | POA: Diagnosis not present

## 2022-05-31 DIAGNOSIS — O99213 Obesity complicating pregnancy, third trimester: Secondary | ICD-10-CM

## 2022-05-31 DIAGNOSIS — O1493 Unspecified pre-eclampsia, third trimester: Secondary | ICD-10-CM | POA: Insufficient documentation

## 2022-05-31 DIAGNOSIS — Z3A35 35 weeks gestation of pregnancy: Secondary | ICD-10-CM | POA: Insufficient documentation

## 2022-05-31 DIAGNOSIS — O0933 Supervision of pregnancy with insufficient antenatal care, third trimester: Secondary | ICD-10-CM | POA: Diagnosis not present

## 2022-05-31 DIAGNOSIS — E669 Obesity, unspecified: Secondary | ICD-10-CM

## 2022-05-31 DIAGNOSIS — O09523 Supervision of elderly multigravida, third trimester: Secondary | ICD-10-CM | POA: Insufficient documentation

## 2022-06-01 ENCOUNTER — Other Ambulatory Visit: Payer: Self-pay | Admitting: *Deleted

## 2022-06-01 DIAGNOSIS — R638 Other symptoms and signs concerning food and fluid intake: Secondary | ICD-10-CM

## 2022-06-01 DIAGNOSIS — Z8759 Personal history of other complications of pregnancy, childbirth and the puerperium: Secondary | ICD-10-CM

## 2022-06-01 DIAGNOSIS — O0933 Supervision of pregnancy with insufficient antenatal care, third trimester: Secondary | ICD-10-CM

## 2022-06-01 LAB — PANORAMA PRENATAL TEST FULL PANEL:PANORAMA TEST PLUS 5 ADDITIONAL MICRODELETIONS

## 2022-06-03 ENCOUNTER — Encounter: Payer: Self-pay | Admitting: Obstetrics and Gynecology

## 2022-06-03 DIAGNOSIS — O285 Abnormal chromosomal and genetic finding on antenatal screening of mother: Secondary | ICD-10-CM | POA: Insufficient documentation

## 2022-06-04 ENCOUNTER — Telehealth: Payer: Self-pay

## 2022-06-04 NOTE — Telephone Encounter (Signed)
-----   Message from [place] Bing, MD sent at 06/03/2022 12:49 AM EDT ----- Please refer to genetic counseling due to atypical findings seen on panorama.

## 2022-06-04 NOTE — Progress Notes (Unsigned)
   PRENATAL VISIT NOTE  Subjective:  Jenny Carr is a 36 y.o. [phone] at [redacted]w[redacted]d being seen today for ongoing prenatal care.  She is currently monitored for the following issues for this high-risk pregnancy and has Obesity (BMI 30-39.9); Obesity in pregnancy; Late prenatal care affecting pregnancy in third trimester; Alpha thalassemia silent carrier; Increased SMA risk; Supervision of other normal pregnancy, antepartum; Short interval between pregnancies affecting pregnancy, antepartum; History of gestational hypertension; and Atypical findings seen on Panorama on their problem list.  Patient reports no complaints.  Contractions: Not present. Vag. Bleeding: None.  Movement: Present. Denies leaking of fluid.   The following portions of the patient's history were reviewed and updated as appropriate: allergies, current medications, past family history, past medical history, past social history, past surgical history and problem list.   Objective:   Vitals:   06/06/22 1149  BP: 127/68  Pulse: 81  Weight: 254 lb (115.2 kg)    Fetal Status: Fetal Heart Rate (bpm): 150 Fundal Height: 36 cm Movement: Present  Presentation: Vertex  General:  Alert, oriented and cooperative. Patient is in no acute distress.  Skin: Skin is warm and dry. No rash noted.   Cardiovascular: Normal heart rate noted  Respiratory: Normal respiratory effort, no problems with respiration noted  Abdomen: Soft, gravid, appropriate for gestational age.  Pain/Pressure: Absent     Pelvic: Cervical exam deferred Dilation: Closed Effacement (%): Thick Station: -3  Extremities: Normal range of motion.     Mental Status: Normal mood and affect. Normal behavior. Normal judgment and thought content.   Assessment and Plan:  Pregnancy: [id number] at [redacted]w[redacted]d 1. Alpha thalassemia silent carrier  2. Late prenatal care affecting pregnancy in third trimester @34  weeks  3. Short interval between pregnancies affecting pregnancy,  antepartum Has 1 year  4. Supervision of other normal pregnancy, antepartum UTD  Vigorous movement   5. Atypical findings seen on Panorama Has genetics visit on 8/31  6. Obesity in pregnancy BMI 47   Preterm labor symptoms and general obstetric precautions including but not limited to vaginal bleeding, contractions, leaking of fluid and fetal movement were reviewed in detail with the patient. Please refer to After Visit Summary for other counseling recommendations.   Return in about 1 week (around 06/13/2022) for Routine prenatal care.  Future Appointments  Date Time Provider Department Center  06/07/2022  8:30 AM [facility] NURSE [facility]  06/07/2022  8:45 AM [facility] NST [facility]  06/12/2022  3:55 PM Hana, Trippett, CNM [facility]  06/14/2022 10:30 AM [facility] NURSE [facility]  06/14/2022 10:45 AM [facility] US6 [facility]  06/14/2022  1:00 PM [facility] GENETIC COUNSELING RM [facility]  06/19/2022  8:30 AM [facility] NURSE [facility]  06/19/2022  8:45 AM [facility] US5 [facility]  06/19/2022  3:55 PM Autry-Lott, 08/19/2022, DO [facility]  06/27/2022  4:15 PM 06/29/2022, CNM [facility]  06/28/2022 10:30 AM [facility] NURSE [facility]  06/28/2022 10:45 AM [facility] US4 [facility]  07/04/2022  1:15 PM [facility] NST [facility]  07/04/2022  2:35 PM Taralee Marcus, 07/06/2022, MD [facility]    [facility] MD

## 2022-06-04 NOTE — Telephone Encounter (Signed)
Call placed to MFM. Pt scheduled for genetic counseling on 8/31 at 1pm.  Call placed to pt. No answer and VM full. Will place message on pink sticky note in Epic to discuss at next OB visit.  Judeth Cornfield, RNC

## 2022-06-05 ENCOUNTER — Encounter: Payer: No Typology Code available for payment source | Admitting: Advanced Practice Midwife

## 2022-06-06 ENCOUNTER — Ambulatory Visit (INDEPENDENT_AMBULATORY_CARE_PROVIDER_SITE_OTHER): Payer: No Typology Code available for payment source | Admitting: Family Medicine

## 2022-06-06 ENCOUNTER — Other Ambulatory Visit (HOSPITAL_COMMUNITY)
Admission: RE | Admit: 2022-06-06 | Discharge: 2022-06-06 | Disposition: A | Discharge status: Home / Self Care | Payer: No Typology Code available for payment source | Source: Ambulatory Visit | Attending: Advanced Practice Midwife | Admitting: Advanced Practice Midwife

## 2022-06-06 VITALS — BP 127/68 | HR 81 | Wt 254.0 lb

## 2022-06-06 DIAGNOSIS — O285 Abnormal chromosomal and genetic finding on antenatal screening of mother: Secondary | ICD-10-CM

## 2022-06-06 DIAGNOSIS — Z3480 Encounter for supervision of other normal pregnancy, unspecified trimester: Secondary | ICD-10-CM

## 2022-06-06 DIAGNOSIS — D563 Thalassemia minor: Secondary | ICD-10-CM

## 2022-06-06 DIAGNOSIS — O0933 Supervision of pregnancy with insufficient antenatal care, third trimester: Secondary | ICD-10-CM | POA: Diagnosis not present

## 2022-06-06 DIAGNOSIS — O99210 Obesity complicating pregnancy, unspecified trimester: Secondary | ICD-10-CM

## 2022-06-06 DIAGNOSIS — Z3A36 36 weeks gestation of pregnancy: Secondary | ICD-10-CM | POA: Diagnosis present

## 2022-06-06 DIAGNOSIS — O09899 Supervision of other high risk pregnancies, unspecified trimester: Secondary | ICD-10-CM

## 2022-06-07 ENCOUNTER — Ambulatory Visit: Payer: No Typology Code available for payment source | Attending: Maternal & Fetal Medicine | Admitting: *Deleted

## 2022-06-07 ENCOUNTER — Encounter: Payer: Self-pay | Admitting: *Deleted

## 2022-06-07 ENCOUNTER — Ambulatory Visit: Payer: No Typology Code available for payment source | Admitting: *Deleted

## 2022-06-07 VITALS — BP 133/64 | HR 77

## 2022-06-07 DIAGNOSIS — O0933 Supervision of pregnancy with insufficient antenatal care, third trimester: Secondary | ICD-10-CM | POA: Insufficient documentation

## 2022-06-07 DIAGNOSIS — Z3A36 36 weeks gestation of pregnancy: Secondary | ICD-10-CM | POA: Diagnosis not present

## 2022-06-07 DIAGNOSIS — O99213 Obesity complicating pregnancy, third trimester: Secondary | ICD-10-CM | POA: Diagnosis present

## 2022-06-07 DIAGNOSIS — Z6841 Body Mass Index (BMI) 40.0 and over, adult: Secondary | ICD-10-CM

## 2022-06-07 DIAGNOSIS — O09293 Supervision of pregnancy with other poor reproductive or obstetric history, third trimester: Secondary | ICD-10-CM | POA: Diagnosis not present

## 2022-06-07 LAB — CERVICOVAGINAL ANCILLARY ONLY
Chlamydia: NEGATIVE
Comment: NEGATIVE
Comment: NORMAL
Neisseria Gonorrhea: NEGATIVE

## 2022-06-07 NOTE — Progress Notes (Signed)
[id number]  

## 2022-06-07 NOTE — Procedures (Signed)
AKELA POCIUS 10-27-1985 [redacted]w[redacted]d  Fetus A Non-Stress Test Interpretation for 06/07/22  Indication:  obesity and LPNC  Fetal Heart Rate A Mode: External Baseline Rate (A): 135 bpm Variability: Moderate Accelerations: 15 x 15 Decelerations: None Multiple birth?: No  Uterine Activity Mode: Toco Contraction Frequency (min): irreg Contraction Duration (sec): 60-80 Contraction Quality: Mild (mild not felt by pt) Resting Tone Palpated: Relaxed  Interpretation (Fetal Testing) Nonstress Test Interpretation: Reactive Overall Impression: Reassuring for gestational age Comments: Tracing reviewed by Dr. Judeth Cornfield

## 2022-06-10 LAB — CULTURE, BETA STREP (GROUP B ONLY): Strep Gp B Culture: NEGATIVE

## 2022-06-14 ENCOUNTER — Ambulatory Visit: Payer: No Typology Code available for payment source | Attending: Maternal & Fetal Medicine

## 2022-06-14 ENCOUNTER — Ambulatory Visit: Payer: No Typology Code available for payment source

## 2022-06-14 ENCOUNTER — Encounter: Payer: Self-pay | Admitting: *Deleted

## 2022-06-14 ENCOUNTER — Ambulatory Visit: Payer: No Typology Code available for payment source | Admitting: *Deleted

## 2022-06-14 VITALS — BP 131/73 | HR 81

## 2022-06-14 DIAGNOSIS — O09293 Supervision of pregnancy with other poor reproductive or obstetric history, third trimester: Secondary | ICD-10-CM

## 2022-06-14 DIAGNOSIS — O0933 Supervision of pregnancy with insufficient antenatal care, third trimester: Secondary | ICD-10-CM | POA: Insufficient documentation

## 2022-06-14 DIAGNOSIS — O99213 Obesity complicating pregnancy, third trimester: Secondary | ICD-10-CM

## 2022-06-14 DIAGNOSIS — R638 Other symptoms and signs concerning food and fluid intake: Secondary | ICD-10-CM | POA: Diagnosis not present

## 2022-06-14 DIAGNOSIS — E669 Obesity, unspecified: Secondary | ICD-10-CM | POA: Diagnosis not present

## 2022-06-14 DIAGNOSIS — Z8759 Personal history of other complications of pregnancy, childbirth and the puerperium: Secondary | ICD-10-CM | POA: Insufficient documentation

## 2022-06-14 DIAGNOSIS — Z3A37 37 weeks gestation of pregnancy: Secondary | ICD-10-CM

## 2022-06-19 ENCOUNTER — Other Ambulatory Visit: Payer: Self-pay | Admitting: Maternal & Fetal Medicine

## 2022-06-19 ENCOUNTER — Encounter: Payer: Self-pay | Admitting: *Deleted

## 2022-06-19 ENCOUNTER — Ambulatory Visit: Payer: No Typology Code available for payment source | Admitting: *Deleted

## 2022-06-19 ENCOUNTER — Ambulatory Visit (INDEPENDENT_AMBULATORY_CARE_PROVIDER_SITE_OTHER): Payer: No Typology Code available for payment source | Admitting: Medical

## 2022-06-19 ENCOUNTER — Ambulatory Visit (HOSPITAL_BASED_OUTPATIENT_CLINIC_OR_DEPARTMENT_OTHER): Payer: No Typology Code available for payment source

## 2022-06-19 ENCOUNTER — Encounter: Payer: No Typology Code available for payment source | Admitting: Family Medicine

## 2022-06-19 VITALS — BP 139/86 | HR 82 | Wt 255.5 lb

## 2022-06-19 VITALS — BP 139/79 | HR 75

## 2022-06-19 DIAGNOSIS — O283 Abnormal ultrasonic finding on antenatal screening of mother: Secondary | ICD-10-CM | POA: Insufficient documentation

## 2022-06-19 DIAGNOSIS — O99213 Obesity complicating pregnancy, third trimester: Secondary | ICD-10-CM | POA: Insufficient documentation

## 2022-06-19 DIAGNOSIS — Z148 Genetic carrier of other disease: Secondary | ICD-10-CM

## 2022-06-19 DIAGNOSIS — O0933 Supervision of pregnancy with insufficient antenatal care, third trimester: Secondary | ICD-10-CM

## 2022-06-19 DIAGNOSIS — O99210 Obesity complicating pregnancy, unspecified trimester: Secondary | ICD-10-CM

## 2022-06-19 DIAGNOSIS — O09293 Supervision of pregnancy with other poor reproductive or obstetric history, third trimester: Secondary | ICD-10-CM

## 2022-06-19 DIAGNOSIS — O09893 Supervision of other high risk pregnancies, third trimester: Secondary | ICD-10-CM

## 2022-06-19 DIAGNOSIS — O285 Abnormal chromosomal and genetic finding on antenatal screening of mother: Secondary | ICD-10-CM

## 2022-06-19 DIAGNOSIS — R638 Other symptoms and signs concerning food and fluid intake: Secondary | ICD-10-CM | POA: Insufficient documentation

## 2022-06-19 DIAGNOSIS — Z8759 Personal history of other complications of pregnancy, childbirth and the puerperium: Secondary | ICD-10-CM | POA: Insufficient documentation

## 2022-06-19 DIAGNOSIS — O09899 Supervision of other high risk pregnancies, unspecified trimester: Secondary | ICD-10-CM

## 2022-06-19 DIAGNOSIS — Z3A38 38 weeks gestation of pregnancy: Secondary | ICD-10-CM

## 2022-06-19 DIAGNOSIS — O4103X0 Oligohydramnios, third trimester, not applicable or unspecified: Secondary | ICD-10-CM

## 2022-06-19 DIAGNOSIS — D563 Thalassemia minor: Secondary | ICD-10-CM | POA: Diagnosis not present

## 2022-06-19 DIAGNOSIS — Z3483 Encounter for supervision of other normal pregnancy, third trimester: Secondary | ICD-10-CM | POA: Diagnosis not present

## 2022-06-19 DIAGNOSIS — E669 Obesity, unspecified: Secondary | ICD-10-CM

## 2022-06-19 DIAGNOSIS — Z3480 Encounter for supervision of other normal pregnancy, unspecified trimester: Secondary | ICD-10-CM

## 2022-06-19 NOTE — Procedures (Signed)
Jenny Carr 04-Apr-1986 [redacted]w[redacted]d  Fetus A Non-Stress Test Interpretation for 06/19/22  Indication: Unsatisfactory BPP  Fetal Heart Rate A Mode: External Baseline Rate (A): 140 bpm Variability: Moderate Accelerations: 15 x 15 Decelerations: None Multiple birth?: No  Uterine Activity Mode: Palpation, Toco Contraction Frequency (min): none Resting Tone Palpated: Relaxed  Interpretation (Fetal Testing) Nonstress Test Interpretation: Reactive Overall Impression: Reassuring for gestational age Comments: Dr. Parke Poisson reviewed tracing

## 2022-06-20 ENCOUNTER — Encounter (HOSPITAL_COMMUNITY): Payer: Self-pay | Admitting: *Deleted

## 2022-06-20 ENCOUNTER — Telehealth (HOSPITAL_COMMUNITY): Payer: Self-pay | Admitting: *Deleted

## 2022-06-20 ENCOUNTER — Encounter (HOSPITAL_COMMUNITY): Payer: Self-pay

## 2022-06-20 NOTE — Telephone Encounter (Signed)
Preadmission screen  

## 2022-06-22 ENCOUNTER — Inpatient Hospital Stay (HOSPITAL_COMMUNITY)
Admission: AD | Admit: 2022-06-22 | Discharge: 2022-06-24 | DRG: 807 | Disposition: A | Discharge status: Home / Self Care | Payer: No Typology Code available for payment source | Attending: Obstetrics and Gynecology | Admitting: Obstetrics and Gynecology

## 2022-06-22 ENCOUNTER — Inpatient Hospital Stay (HOSPITAL_COMMUNITY): Payer: No Typology Code available for payment source

## 2022-06-22 ENCOUNTER — Encounter (HOSPITAL_COMMUNITY): Payer: Self-pay | Admitting: Obstetrics and Gynecology

## 2022-06-22 DIAGNOSIS — O418X30 Other specified disorders of amniotic fluid and membranes, third trimester, not applicable or unspecified: Secondary | ICD-10-CM | POA: Diagnosis present

## 2022-06-22 DIAGNOSIS — O99214 Obesity complicating childbirth: Secondary | ICD-10-CM | POA: Diagnosis present

## 2022-06-22 DIAGNOSIS — Z3A38 38 weeks gestation of pregnancy: Secondary | ICD-10-CM

## 2022-06-22 DIAGNOSIS — O139 Gestational [pregnancy-induced] hypertension without significant proteinuria, unspecified trimester: Secondary | ICD-10-CM | POA: Diagnosis not present

## 2022-06-22 DIAGNOSIS — O134 Gestational [pregnancy-induced] hypertension without significant proteinuria, complicating childbirth: Secondary | ICD-10-CM | POA: Diagnosis not present

## 2022-06-22 DIAGNOSIS — O4103X0 Oligohydramnios, third trimester, not applicable or unspecified: Principal | ICD-10-CM | POA: Diagnosis present

## 2022-06-22 LAB — TYPE AND SCREEN
ABO/RH(D): A POS
Antibody Screen: NEGATIVE

## 2022-06-22 LAB — PROTEIN / CREATININE RATIO, URINE
Creatinine, Urine: 211 mg/dL
Protein Creatinine Ratio: 0.17 mg/mg{Cre} — ABNORMAL HIGH (ref 0.00–0.15)
Total Protein, Urine: 35 mg/dL

## 2022-06-22 LAB — COMPREHENSIVE METABOLIC PANEL
ALT: 8 U/L (ref 0–44)
AST: 22 U/L (ref 15–41)
Albumin: 3 g/dL — ABNORMAL LOW (ref 3.5–5.0)
Alkaline Phosphatase: 141 U/L — ABNORMAL HIGH (ref 38–126)
Anion gap: 12 (ref 5–15)
BUN: 10 mg/dL (ref 6–20)
CO2: 19 mmol/L — ABNORMAL LOW (ref 22–32)
Calcium: 9.3 mg/dL (ref 8.9–10.3)
Chloride: 103 mmol/L (ref 98–111)
Creatinine, Ser: 0.59 mg/dL (ref 0.44–1.00)
GFR, Estimated: >60 mL/min (ref >60)
Glucose, Bld: 162 mg/dL — ABNORMAL HIGH (ref 70–99)
Potassium: 3.8 mmol/L (ref 3.5–5.1)
Sodium: 134 mmol/L — ABNORMAL LOW (ref 135–145)
Total Bilirubin: 0.4 mg/dL (ref 0.3–1.2)
Total Protein: 6.5 g/dL (ref 6.5–8.1)

## 2022-06-22 LAB — CBC
HCT: 31.7 % — ABNORMAL LOW (ref 36.0–46.0)
Hemoglobin: 10.2 g/dL — ABNORMAL LOW (ref 12.0–15.0)
MCH: 23.7 pg — ABNORMAL LOW (ref 26.0–34.0)
MCHC: 32.2 g/dL (ref 30.0–36.0)
MCV: 73.7 fL — ABNORMAL LOW (ref 80.0–100.0)
Platelets: 259 10*3/uL (ref 150–400)
RBC: 4.3 MIL/uL (ref 3.87–5.11)
RDW: 15.8 % — ABNORMAL HIGH (ref 11.5–15.5)
WBC: 8.2 10*3/uL (ref 4.0–10.5)
nRBC: 0 % (ref 0.0–0.2)

## 2022-06-22 MED ORDER — MISOPROSTOL 50MCG HALF TABLET
50.0000 ug | ORAL_TABLET | Freq: Once | ORAL | Status: AC
  Administered 2022-06-22: 50 ug via VAGINAL
  Filled 2022-06-22: qty 1

## 2022-06-22 MED ORDER — OXYTOCIN-SODIUM CHLORIDE 30-0.9 UT/500ML-% IV SOLN: 2.5000 [IU]/h | INTRAVENOUS | Status: DC

## 2022-06-22 MED ORDER — OXYCODONE-ACETAMINOPHEN 5-325 MG PO TABS: 2.0000 | ORAL_TABLET | ORAL | Status: DC | PRN

## 2022-06-22 MED ORDER — LACTATED RINGERS IV SOLN: 500.0000 mL | INTRAVENOUS | Status: DC | PRN

## 2022-06-22 MED ORDER — LACTATED RINGERS IV SOLN: INTRAVENOUS | Status: DC

## 2022-06-22 MED ORDER — OXYCODONE-ACETAMINOPHEN 5-325 MG PO TABS
1.0000 | ORAL_TABLET | ORAL | Status: DC | PRN
  Administered 2022-06-23: 1 via ORAL
  Filled 2022-06-22: qty 1

## 2022-06-22 MED ORDER — ONDANSETRON HCL 4 MG/2ML IJ SOLN: 4.0000 mg | Freq: Four times a day (QID) | INTRAMUSCULAR | Status: DC | PRN

## 2022-06-22 MED ORDER — MISOPROSTOL 50MCG HALF TABLET
50.0000 ug | ORAL_TABLET | Freq: Once | ORAL | Status: AC
  Administered 2022-06-22: 50 ug via ORAL
  Filled 2022-06-22: qty 1

## 2022-06-22 MED ORDER — TERBUTALINE SULFATE 1 MG/ML IJ SOLN: 0.2500 mg | Freq: Once | INTRAMUSCULAR | Status: DC | PRN

## 2022-06-22 MED ORDER — OXYTOCIN BOLUS FROM INFUSION: 333.0000 mL | Freq: Once | INTRAVENOUS | Status: DC

## 2022-06-22 MED ORDER — LIDOCAINE HCL (PF) 1 % IJ SOLN: 30.0000 mL | INTRAMUSCULAR | Status: DC | PRN

## 2022-06-22 MED ORDER — ACETAMINOPHEN 325 MG PO TABS: 650.0000 mg | ORAL_TABLET | ORAL | Status: DC | PRN

## 2022-06-22 MED ORDER — SOD CITRATE-CITRIC ACID 500-334 MG/5ML PO SOLN: 30.0000 mL | ORAL | Status: DC | PRN

## 2022-06-22 NOTE — Progress Notes (Signed)
Jenny Carr is a 36 y.o. [phone] at [redacted]w[redacted]d by ultrasound admitted for induction of labor due to low normal amniotic fluid.  Subjective: Pt comfortable, reports mild irregular cramping.    Objective: BP 138/72   Pulse 93   Resp 16   Ht 5\' 1"  (1.549 m)   Wt 116.1 kg   LMP 09/26/2021 (Approximate) Comment: SVD 07-16-2021  BMI 48.37 kg/m  No intake/output data recorded. No intake/output data recorded.  FHT:  FHR: 140 bpm, variability: moderate,  accelerations:  Present,  decelerations:  Absent UC:   none SVE:   Dilation: 2 Effacement (%): 40 Station: -3 Exam by:: [email or web address], CNM  Labs: Lab Results  Component Value Date   WBC 8.2 06/22/2022   HGB 10.2 (L) 06/22/2022   HCT 31.7 (L) 06/22/2022   MCV 73.7 (L) 06/22/2022   PLT 259 06/22/2022    Assessment / Plan: Induction of labor due to low normal amniotic fluid,  progressing well on pitocin  Labor:  Cytotec 50 buccal/50 vaginal placed. Foley balloon placed without difficulty.  Pt tolerated well. Plan for RN to provide traction on foley balloon and CNM will recheck in 4-5 hours if foley still in place.  Preeclampsia:   Elevated BP on admission but cuff changed and BP normal. PEC labs pending.  Fetal Wellbeing:  Category I Pain Control:  Labor support without medications I/D:   GBS neg Anticipated MOD:  NSVD  08/22/2022, CNM 06/22/2022, 10:44 PM

## 2022-06-22 NOTE — H&P (Signed)
Jenny Carr is a 36 y.o. female presenting for IOL per MFM for low normal amniotic fluid.   Hx significant for BMI 47, GHTN previous pregnancy, close interval between pregnancies.  [place] on 05/31/22 at 35 weeks with FHR 130, posterior placenta, cephalic position, AFI wnl, EFW 3143g, 89%  Largest previous baby 3751g without complication.   BPP on 9/5 with AFI 5.12, 8/10 with 2 off for breathing.   Nursing Staff Provider  Office Location  [facility] Dating  31 wk [place]  [facility] Model [X]  Traditional [ ]  Centering [ ]  Mom-Baby Dyad    Language  English Anatomy  normal  Flu Vaccine   Genetic/Carrier Screen  NIPS:   invalid AFP:   Too late Horizon: alpha thal carrier, increased risk SMA  TDaP Vaccine    Hgb A1C or  GTT Early  Third trimester neg 1hr  COVID Vaccine    LAB RESULTS   Rhogam  NA Blood Type A/Positive/-- (08/07 1610)   Baby Feeding Plan Breast Antibody Negative (08/07 1610)  Contraception Tubal Rubella 6.03 (08/07 1610)imm  Circumcision NA RPR Non Reactive (08/07 1610)  Pediatrician  [facility] HBsAg Negative (08/07 1610)  Support Person FOB HCVAb neg  Prenatal Classes NA HIV Non Reactive (08/07 1610)     BTL Consent 05/21/22 GBS Negative/-- (08/23 1206)  VBAC Consent NA Pap Neg 2021       DME Rx [ ]  BP cuff [ ]  Weight Scale Waterbirth  [ ]  Class [ ]  Consent [ ]  CNM visit  PHQ9 & GAD7 [ X] new OB [  ] 28 weeks  [  ] 36 weeks Induction  [ ]  Orders Entered [ ] Foley Y/N   OB History     Gravida  4   Para  3   Term  3   Preterm      AB      Living  3      SAB      IAB      Ectopic      Multiple  0   Live Births  3          Past Medical History:  Diagnosis Date   Alpha thalassemia silent carrier    History of gestational hypertension 06/22/2021   BP @NOB  appt 133/83, BP 141/75 on 9/8, BP 141/82 on 9/22   History of gestational hypertension    Wears glasses    History reviewed. No pertinent surgical history. Family History: family history  includes Cancer in her father. Social History:  reports that she has never smoked. She has never used smokeless tobacco. She reports that she does not currently use alcohol. She reports that she does not use drugs.     Maternal Diabetes: No Genetic Screening: Abnormal:  Results: Other: Maternal Ultrasounds/Referrals: Normal Fetal Ultrasounds or other Referrals:  None Maternal Substance Abuse:  No Significant Maternal Medications:  None Significant Maternal Lab Results:  Group B Strep negative Number of Prenatal Visits:Less than or equal to 3 verified prenatal visits Other Comments:   NIPS with atypical finding for unknown reasons.   Review of Systems  Constitutional:  Negative for chills, fatigue and fever.  Eyes:  Negative for visual disturbance.  Respiratory:  Negative for shortness of breath.   Cardiovascular:  Negative for chest pain.  Gastrointestinal:  Negative for abdominal pain and vomiting.  Genitourinary:  Negative for difficulty urinating, dysuria, flank pain, pelvic pain, vaginal bleeding, vaginal discharge and vaginal pain.  Neurological:  Negative  for dizziness and headaches.  Psychiatric/Behavioral: Negative.     Maternal Medical History:  Reason for admission: Low normal amniotic fluid  Contractions: Frequency: irregular.   Perceived severity is mild.   Fetal activity: Perceived fetal activity is normal.   Prenatal complications: no prenatal complications Prenatal Complications - Diabetes: none.   Dilation: 2 Effacement (%): 40 Station: -3 Exam by:: Misty Stanley LEFTWICH-KIRBY, CNM Blood pressure 138/72, pulse 93, resp. rate 16, height 5\' 1"  (1.549 m), weight 116.1 kg, last menstrual period 09/26/2021, not currently breastfeeding. Maternal Exam:  Uterine Assessment: Contraction strength is mild.  Contraction frequency is rare.  Abdomen: Estimated fetal weight is 89% at 35 weeks.   Fetal presentation: vertex Pelvis: adequate for delivery.   Cervix: Cervix evaluated  by digital exam.     Fetal Exam Fetal Monitor Review: Mode: ultrasound.   Baseline rate: 140.  Variability: moderate (6-25 bpm).   Pattern: accelerations present and no decelerations.   Fetal State Assessment: Category I - tracings are normal.   Physical Exam Vitals and nursing note reviewed.  Constitutional:      Appearance: She is well-developed.  Cardiovascular:     Rate and Rhythm: Normal rate.     Heart sounds: Normal heart sounds.  Pulmonary:     Effort: Pulmonary effort is normal.     Breath sounds: Normal breath sounds.  Abdominal:     Palpations: Abdomen is soft.  Musculoskeletal:        General: Normal range of motion.     Cervical back: Normal range of motion.  Skin:    General: Skin is warm and dry.  Neurological:     Mental Status: She is alert and oriented to person, place, and time.  Psychiatric:        Behavior: Behavior normal.        Thought Content: Thought content normal.        Judgment: Judgment normal.     Prenatal labs: ABO, Rh: --/--/A POS (09/08 2128) Antibody: NEG (09/08 2128) Rubella: 6.03 (08/07 1610) RPR: Non Reactive (08/07 1610)  HBsAg: Negative (08/07 1610)  HIV: Non Reactive (08/07 1610)  GBS: Negative/-- (08/23 1206)   Assessment/Plan: 10-20-2003 at [redacted]w[redacted]d  admitted for IOL for low normal amniotic fluid per MFM GBS negative PEC labs pending with BP elevated on admission but not recurrent Plan Cytotec/foley balloon Anticipate NSVD   [redacted]w[redacted]d 06/22/2022, 10:47 PM

## 2022-06-23 ENCOUNTER — Encounter (HOSPITAL_COMMUNITY): Payer: Self-pay | Admitting: Obstetrics and Gynecology

## 2022-06-23 ENCOUNTER — Inpatient Hospital Stay (HOSPITAL_COMMUNITY): Payer: No Typology Code available for payment source | Admitting: Anesthesiology

## 2022-06-23 ENCOUNTER — Other Ambulatory Visit: Payer: Self-pay

## 2022-06-23 ENCOUNTER — Encounter (HOSPITAL_COMMUNITY)
Admission: AD | Disposition: A | Discharge status: Home / Self Care | Payer: Self-pay | Source: Home / Self Care | Attending: Obstetrics and Gynecology

## 2022-06-23 DIAGNOSIS — Z3A38 38 weeks gestation of pregnancy: Secondary | ICD-10-CM

## 2022-06-23 DIAGNOSIS — O134 Gestational [pregnancy-induced] hypertension without significant proteinuria, complicating childbirth: Secondary | ICD-10-CM

## 2022-06-23 LAB — RPR: RPR Ser Ql: NONREACTIVE

## 2022-06-23 SURGERY — LIGATION, FALLOPIAN TUBE, POSTPARTUM
Anesthesia: Choice | Laterality: Bilateral

## 2022-06-23 MED ORDER — ACETAMINOPHEN 325 MG PO TABS: 650.0000 mg | ORAL_TABLET | ORAL | Status: DC | PRN

## 2022-06-23 MED ORDER — FENTANYL CITRATE (PF) 100 MCG/2ML IJ SOLN
INTRAMUSCULAR | Status: AC
  Filled 2022-06-23: qty 2

## 2022-06-23 MED ORDER — LACTATED RINGERS IV SOLN: INTRAVENOUS | Status: DC

## 2022-06-23 MED ORDER — DIBUCAINE (PERIANAL) 1 % EX OINT: 1.0000 | TOPICAL_OINTMENT | CUTANEOUS | Status: DC | PRN

## 2022-06-23 MED ORDER — SENNOSIDES-DOCUSATE SODIUM 8.6-50 MG PO TABS
2.0000 | ORAL_TABLET | Freq: Every day | ORAL | Status: DC
  Administered 2022-06-24: 2 via ORAL
  Filled 2022-06-23: qty 2

## 2022-06-23 MED ORDER — BENZOCAINE-MENTHOL 20-0.5 % EX AERO
1.0000 | INHALATION_SPRAY | CUTANEOUS | Status: DC | PRN
  Filled 2022-06-23: qty 56

## 2022-06-23 MED ORDER — SIMETHICONE 80 MG PO CHEW: 80.0000 mg | CHEWABLE_TABLET | ORAL | Status: DC | PRN

## 2022-06-23 MED ORDER — DIPHENHYDRAMINE HCL 25 MG PO CAPS: 25.0000 mg | ORAL_CAPSULE | Freq: Four times a day (QID) | ORAL | Status: DC | PRN

## 2022-06-23 MED ORDER — WITCH HAZEL-GLYCERIN EX PADS: 1.0000 | MEDICATED_PAD | CUTANEOUS | Status: DC | PRN

## 2022-06-23 MED ORDER — OXYTOCIN 10 UNIT/ML IJ SOLN
10.0000 [IU] | Freq: Once | INTRAMUSCULAR | Status: AC
  Administered 2022-06-23: 10 [IU] via INTRAMUSCULAR

## 2022-06-23 MED ORDER — FENTANYL-BUPIVACAINE-NACL 0.5-0.125-0.9 MG/250ML-% EP SOLN: 12.0000 mL/h | EPIDURAL | Status: DC | PRN

## 2022-06-23 MED ORDER — PHENYLEPHRINE 80 MCG/ML (10ML) SYRINGE FOR IV PUSH (FOR BLOOD PRESSURE SUPPORT): 80.0000 ug | PREFILLED_SYRINGE | INTRAVENOUS | Status: DC | PRN

## 2022-06-23 MED ORDER — TETANUS-DIPHTH-ACELL PERTUSSIS 5-2.5-18.5 LF-MCG/0.5 IM SUSY: 0.5000 mL | PREFILLED_SYRINGE | Freq: Once | INTRAMUSCULAR | Status: DC

## 2022-06-23 MED ORDER — LACTATED RINGERS IV SOLN: 500.0000 mL | Freq: Once | INTRAVENOUS | Status: DC

## 2022-06-23 MED ORDER — OXYCODONE HCL 5 MG PO TABS: 5.0000 mg | ORAL_TABLET | ORAL | Status: DC | PRN

## 2022-06-23 MED ORDER — FENTANYL CITRATE (PF) 100 MCG/2ML IJ SOLN
100.0000 ug | INTRAMUSCULAR | Status: DC | PRN
  Administered 2022-06-23: 100 ug via INTRAVENOUS
  Filled 2022-06-23: qty 2

## 2022-06-23 MED ORDER — ZOLPIDEM TARTRATE 5 MG PO TABS: 5.0000 mg | ORAL_TABLET | Freq: Every evening | ORAL | Status: DC | PRN

## 2022-06-23 MED ORDER — EPHEDRINE 5 MG/ML INJ: 10.0000 mg | INTRAVENOUS | Status: DC | PRN

## 2022-06-23 MED ORDER — ONDANSETRON HCL 4 MG/2ML IJ SOLN: 4.0000 mg | INTRAMUSCULAR | Status: DC | PRN

## 2022-06-23 MED ORDER — ONDANSETRON HCL 4 MG PO TABS: 4.0000 mg | ORAL_TABLET | ORAL | Status: DC | PRN

## 2022-06-23 MED ORDER — FAMOTIDINE 20 MG PO TABS
40.0000 mg | ORAL_TABLET | Freq: Once | ORAL | Status: AC
  Administered 2022-06-23: 40 mg via ORAL
  Filled 2022-06-23: qty 2

## 2022-06-23 MED ORDER — COCONUT OIL OIL: 1.0000 | TOPICAL_OIL | Status: DC | PRN

## 2022-06-23 MED ORDER — OXYCODONE HCL 5 MG PO TABS: 10.0000 mg | ORAL_TABLET | ORAL | Status: DC | PRN

## 2022-06-23 MED ORDER — BUPIVACAINE HCL (PF) 0.25 % IJ SOLN
INTRAMUSCULAR | Status: AC
  Filled 2022-06-23: qty 10

## 2022-06-23 MED ORDER — DEXAMETHASONE SODIUM PHOSPHATE 4 MG/ML IJ SOLN
INTRAMUSCULAR | Status: AC
  Filled 2022-06-23: qty 1

## 2022-06-23 MED ORDER — METOCLOPRAMIDE HCL 10 MG PO TABS
10.0000 mg | ORAL_TABLET | Freq: Once | ORAL | Status: AC
  Administered 2022-06-23: 10 mg via ORAL
  Filled 2022-06-23: qty 1

## 2022-06-23 MED ORDER — PRENATAL MULTIVITAMIN CH
1.0000 | ORAL_TABLET | Freq: Every day | ORAL | Status: DC
  Administered 2022-06-23 – 2022-06-24 (×2): 1 via ORAL
  Filled 2022-06-23 (×2): qty 1

## 2022-06-23 MED ORDER — ONDANSETRON HCL 4 MG/2ML IJ SOLN
INTRAMUSCULAR | Status: AC
  Filled 2022-06-23: qty 2

## 2022-06-23 MED ORDER — SOD CITRATE-CITRIC ACID 500-334 MG/5ML PO SOLN
30.0000 mL | Freq: Once | ORAL | Status: AC
Start: 2022-06-23 — End: 2022-06-23
  Administered 2022-06-23: 30 mL via ORAL
  Filled 2022-06-23: qty 30

## 2022-06-23 MED ORDER — OXYTOCIN 10 UNIT/ML IJ SOLN
INTRAMUSCULAR | Status: AC
  Filled 2022-06-23: qty 1

## 2022-06-23 MED ORDER — IBUPROFEN 600 MG PO TABS
600.0000 mg | ORAL_TABLET | Freq: Four times a day (QID) | ORAL | Status: DC
  Administered 2022-06-23 – 2022-06-24 (×6): 600 mg via ORAL
  Filled 2022-06-23 (×6): qty 1

## 2022-06-23 MED ORDER — DIPHENHYDRAMINE HCL 50 MG/ML IJ SOLN: 12.5000 mg | INTRAMUSCULAR | Status: DC | PRN

## 2022-06-23 MED ORDER — KETOROLAC TROMETHAMINE 30 MG/ML IJ SOLN
INTRAMUSCULAR | Status: AC
  Filled 2022-06-23: qty 1

## 2022-06-23 NOTE — Lactation Note (Signed)
This note was copied from a baby's chart. Lactation Consultation Note  Patient Name: Jenny Carr WNUUV'O Date: 06/23/2022 Reason for consult: L&D Initial assessment;Early term 37-38.6wks Age:34 hours Assisted baby to sitting up right football position. Baby not very interested or just wouldn't open mouth wide enough to latch. Baby crying a lot. Mom is Breast/formula. Mom stated she never got any milk w/her other children. Mom stated she is going to try but she will have to give formula as well. Mom in a lot of vaginal pain. Reported to RN. Lactation will f/u mom on MBU.  Maternal Data Does the patient have breastfeeding experience prior to this delivery?: Yes How long did the patient breastfeed?: few days to week  Feeding    LATCH Score Latch: Too sleepy or reluctant, no latch achieved, no sucking elicited.  Audible Swallowing: None  Type of Nipple: Everted at rest and after stimulation  Comfort (Breast/Nipple): Soft / non-tender  Hold (Positioning): Full assist, staff holds infant at breast  LATCH Score: 4   Lactation Tools Discussed/Used    Interventions Interventions: Adjust position;Assisted with latch;Support pillows;Skin to skin;Position options;Breast compression  Discharge    Consult Status Consult Status: Follow-up from L&D Date: 06/23/22 Follow-up type: In-patient    Charyl Dancer 06/23/2022, 2:38 AM

## 2022-06-23 NOTE — Progress Notes (Signed)
Due to emergent OR cases, Jenny Carr's case has been delayed.  Options reviewed with patient she would prefer interval PP BTL.  Advised pt to return in 2-3 weeks to the office to schedule her surgery as an outpatient.  Myna Hidalgo, DO Attending Obstetrician & Gynecologist, [facility] for [employer], [facility]

## 2022-06-23 NOTE — Progress Notes (Signed)
POSTPARTUM PROGRESS NOTE  PPD #0  Subjective:  Jenny Carr is a 36 y.o. [id number] s/p NSVD at [redacted]w[redacted]d. Today she notes no acute complaints.  She denies any problems with ambulating or voiding.  Currently NPO for upcoming BTL.  Denies fever/chills/chest pain/SOB.    Objective: Blood pressure 135/77, pulse 77, temperature 98 F (36.7 C), temperature source Oral, resp. rate 18, height 5\' 1"  (1.549 m), weight 116.1 kg, last menstrual period 09/26/2021, unknown if currently breastfeeding.  Physical Exam:  General: alert, cooperative and no distress Chest: no respiratory distress Heart: regular rate and rhythm Abdomen: obese, soft, uterus below umbilicus, ?supraumbilical hernia  DVT Evaluation: No calf swelling or tenderness Extremities: minimal edema Skin: warm, dry  Results for orders placed or performed during the hospital encounter of 06/22/22 (from the past 24 hour(s))  Type and screen [facility]     Status: None   Collection Time: 06/22/22  9:28 PM  Result Value Ref Range   ABO/RH(D) A POS    Antibody Screen NEG    Sample Expiration      06/25/2022,2359 Performed at [facility], [address], [place], [place]   CBC     Status: Abnormal   Collection Time: 06/22/22  9:31 PM  Result Value Ref Range   WBC 8.2 4.0 - 10.5 K/uL   RBC 4.30 3.87 - 5.11 MIL/uL   Hemoglobin 10.2 (L) 12.0 - 15.0 g/dL   HCT 08/22/22 (L) 18.5 - 63.1 %   MCV 73.7 (L) 80.0 - 100.0 fL   MCH 23.7 (L) 26.0 - 34.0 pg   MCHC 32.2 30.0 - 36.0 g/dL   RDW 49.7 (H) 02.6 - 37.8 %   Platelets 259 150 - 400 K/uL   nRBC 0.0 0.0 - 0.2 %  Comprehensive metabolic panel     Status: Abnormal   Collection Time: 06/22/22  9:31 PM  Result Value Ref Range   Sodium 134 (L) 135 - 145 mmol/L   Potassium 3.8 3.5 - 5.1 mmol/L   Chloride 103 98 - 111 mmol/L   CO2 19 (L) 22 - 32 mmol/L   Glucose, Bld 162 (H) 70 - 99 mg/dL   BUN 10 6 - 20 mg/dL   Creatinine, Ser 08/22/22 0.44 - 1.00 mg/dL    Calcium 9.3 8.9 - 5.02 mg/dL   Total Protein 6.5 6.5 - 8.1 g/dL   Albumin 3.0 (L) 3.5 - 5.0 g/dL   AST 22 15 - 41 U/L   ALT 8 0 - 44 U/L   Alkaline Phosphatase 141 (H) 38 - 126 U/L   Total Bilirubin 0.4 0.3 - 1.2 mg/dL   GFR, Estimated 77.4 >12 mL/min   Anion gap 12 5 - 15  Protein / creatinine ratio, urine     Status: Abnormal   Collection Time: 06/22/22  9:31 PM  Result Value Ref Range   Creatinine, Urine 211 mg/dL   Total Protein, Urine 35 mg/dL   Protein Creatinine Ratio 0.17 (H) 0.00 - 0.15 mg/mg[Cre]    Assessment/Plan: Jenny Carr is a 36 y.o. 20 s/p NSVD at [redacted]w[redacted]d POD#0  -pt desires permanent sterilization -Bilateral tubal ligation reviewed, if possible plan for salpingectomy.  Reviewed with R&B including but not limited to bleeding, infection, injury to other organs, irreversibility and failure rate of 10/998. The absence of effect on future cycle, PMS and menopause also discussed. Questions answered. -continue routine postpartum care  [redacted]w[redacted]d, DO Attending Obstetrician & Gynecologist, Faculty Practice  Center for [employer], [facility]

## 2022-06-23 NOTE — Discharge Summary (Signed)
Postpartum Discharge Summary  Date of Service updated- yes     Patient Name: Jenny Carr DOB: 1986/10/12 MRN: [id number]  Date of admission: 06/22/2022 Delivery date:06/23/2022  Delivering provider: Fatima Blank A  Date of discharge: 06/24/2022  Admitting diagnosis: Gestational hypertension [O13.9] Intrauterine pregnancy: [redacted]w[redacted]d    Secondary diagnosis:  Active Problems:   NSVD (normal spontaneous vaginal delivery)  Additional problems: None   Discharge diagnosis: Term Pregnancy Delivered                                              Post partum procedures: none Augmentation: Cytotec and IP Foley Complications: None  Hospital course: Induction of Labor With Vaginal Delivery   36y.o. yo [id number] admitted to the hospital 06/22/2022 for induction of labor.  Indication for induction: .  Patient had an uncomplicated labor course as follows: Membrane Rupture Time/Date: 12:50 AM ,06/23/2022   Delivery Method:Vaginal, Spontaneous  Episiotomy: None  Lacerations:  1st degree  Details of delivery can be found in separate delivery note.  Patient had a routine postpartum course. Patient is discharged home 06/24/22.  Newborn Data: Birth date:06/23/2022  Birth time:1:50 AM  Gender:Female  Living status:Living  Apgars:9 ,9  Weight:3520 g   Magnesium Sulfate received: No BMZ received: No Rhophylac:No MMR:No T-DaP:Given prenatally Flu: No Transfusion:No  Physical exam  Vitals:   06/23/22 1140 06/23/22 1336 06/23/22 2127 06/24/22 0555  BP: (!) 125/57 127/73 121/81 121/87  Pulse: 77  79 76  Resp: '18 18 19 17  ' Temp: 97.7 F (36.5 C) 97.7 F (36.5 C) 98.2 F (36.8 C) 98.2 F (36.8 C)  TempSrc: Oral Oral Oral Oral  SpO2: 100% 99% 100% 100%  Weight:      Height:       See exam findings by Dr CrCaron Presumeated 06/24/2022    Labs: Lab Results  Component Value Date   WBC 8.2 06/22/2022   HGB 10.2 (L) 06/22/2022   HCT 31.7 (L) 06/22/2022   MCV 73.7 (L)  06/22/2022   PLT 259 06/22/2022      Latest Ref Rng & Units 06/22/2022    9:31 PM  CMP  Glucose 70 - 99 mg/dL 162   BUN 6 - 20 mg/dL 10   Creatinine 0.44 - 1.00 mg/dL 0.59   Sodium 135 - 145 mmol/L 134   Potassium 3.5 - 5.1 mmol/L 3.8   Chloride 98 - 111 mmol/L 103   CO2 22 - 32 mmol/L 19   Calcium 8.9 - 10.3 mg/dL 9.3   Total Protein 6.5 - 8.1 g/dL 6.5   Total Bilirubin 0.3 - 1.2 mg/dL 0.4   Alkaline Phos 38 - 126 U/L 141   AST 15 - 41 U/L 22   ALT 0 - 44 U/L 8    Edinburgh Score:    06/23/2022    9:16 AM  Edinburgh Postnatal Depression Scale Screening Tool  I have been able to laugh and see the funny side of things. 0  I have looked forward with enjoyment to things. 0  I have blamed myself unnecessarily when things went wrong. 0  I have been anxious or worried for no good reason. 0  I have felt scared or panicky for no good reason. 0  Things have been getting on top of me. 0  I have been so unhappy  that I have had difficulty sleeping. 0  I have felt sad or miserable. 0  I have been so unhappy that I have been crying. 0  The thought of harming myself has occurred to me. 0  Edinburgh Postnatal Depression Scale Total 0     After visit meds:  Allergies as of 06/24/2022   No Known Allergies      Medication List     TAKE these medications    ferrous sulfate 325 (65 FE) MG tablet Commonly known as: FerrouSul Take 1 tablet (325 mg total) by mouth every other day.   ibuprofen 600 MG tablet Commonly known as: ADVIL Take 1 tablet (600 mg total) by mouth every 6 (six) hours.   Prenatal 27-1 MG Tabs Take 1 tablet by mouth daily.         Discharge home in stable condition Infant Feeding: Breast Infant Disposition:home with mother Discharge instruction: per After Visit Summary and Postpartum booklet. Activity: Advance as tolerated. Pelvic rest for 6 weeks.  Diet: routine diet Future Appointments: Future Appointments  Date Time Provider [place]   06/28/2022 10:30 AM [facility] NURSE [facility]  06/28/2022 10:45 AM [facility] US4 [facility] [place]   Follow up Visit:  Message to [place] on 06/23/22: Please schedule this patient for a In person postpartum visit in 6 weeks with the following provider: Any provider. Additional Postpartum F/U:BP check 1 week  Low risk pregnancy complicated by:  low normal amniotic fluid Delivery mode:  Vaginal, Spontaneous  Anticipated Birth Control:  BTL done PP   06/24/2022 Makana Rostad Sherrilyn Rist, MD

## 2022-06-23 NOTE — Lactation Note (Signed)
This note was copied from a baby's chart. Lactation Consultation Note  Patient Name: Girl Natika Geyer [id number] Date: 06/23/2022 Reason for consult: Initial assessment;Early term 37-38.6wks Age:34 hours  Parent awaiting tubal.  LC offered to assist with latching baby sucking and cueing.    Parent did want to try; states baby had multiple attempts early this morning but could not stay latched.  Baby in blankets and mom gowned up for sx.  LC helped with latching in football hold.   Infant grasped breast with a few sucks, then pulled away.  This happened multiple times before baby feel asleep.   LC swaddled and placed baby in bassinet.  Parent asked about DEBP to begin stimulation to milk supply.  Requested pump set up after back from SX. Education provided. Encouraged STS after SX, hand expression, pumping to stimulate supply.    Maternal Data Has patient been taught Hand Expression?: Yes  Feeding Mother's Current Feeding Choice: Breast Milk and Formula  LATCH Score Latch: Repeated attempts needed to sustain latch, nipple held in mouth throughout feeding, stimulation needed to elicit sucking reflex.  Audible Swallowing: None  Type of Nipple: Everted at rest and after stimulation  Comfort (Breast/Nipple): Soft / non-tender  Hold (Positioning): Full assist, staff holds infant at breast  LATCH Score: 5   Lactation Tools Discussed/Used    Interventions Interventions: Breast feeding basics reviewed;Hand express;Breast massage;Support pillows;Education;LC Services brochure  Discharge Discharge Education: [facility] suggested follow up wtih Noralee Stain for OP support) Pump: Personal ([facility]ela)  Consult Status Consult Status: Follow-up Date: 06/24/22 Follow-up type: In-patient    Maryruth H[facility] 06/23/2022, 9:50 AM

## 2022-06-23 NOTE — Anesthesia Preprocedure Evaluation (Addendum)
Anesthesia Evaluation  Patient identified by MRN, date of birth, ID band Patient awake    Reviewed: Allergy & Precautions, NPO status , Patient's Chart, lab work & pertinent test results  Airway        Dental   Pulmonary neg pulmonary ROS,           Cardiovascular   Gestational HTN   Neuro/Psych negative neurological ROS  negative psych ROS   GI/Hepatic Neg liver ROS, GERD  ,  Endo/Other  Morbid obesity  Renal/GU negative Renal ROS  negative genitourinary   Musculoskeletal   Abdominal   Peds  Hematology  (+) Blood dyscrasia, anemia , Alpha thalassemia carrier   Anesthesia Other Findings   Reproductive/Obstetrics Desires sterilization post partum                            Anesthesia Physical Anesthesia Plan  ASA: 3  Anesthesia Plan: Spinal   Post-op Pain Management: Regional block* and Minimal or no pain anticipated   Induction: Intravenous  PONV Risk Score and Plan: 4 or greater and Treatment may vary due to age or medical condition, Ondansetron and Dexamethasone  Airway Management Planned: Natural Airway  Additional Equipment: None  Intra-op Plan:   Post-operative Plan:   Informed Consent:   Plan Discussed with:   Anesthesia Plan Comments:         Anesthesia Quick Evaluation

## 2022-06-24 MED ORDER — IBUPROFEN 600 MG PO TABS: 600.0000 mg | ORAL_TABLET | Freq: Four times a day (QID) | ORAL | 0 refills | Status: AC

## 2022-06-24 MED ORDER — INFLUENZA VAC SPLIT QUAD 0.5 ML IM SUSY: 0.5000 mL | PREFILLED_SYRINGE | INTRAMUSCULAR | Status: DC

## 2022-06-24 NOTE — Progress Notes (Addendum)
POSTPARTUM PROGRESS NOTE  Post Partum Day 1  Subjective:  Jenny Carr is a 36 y.o. [id number] s/p VD at [redacted]w[redacted]d.  She reports she is doing well. No acute events overnight. She denies any problems with ambulating, voiding or po intake. Denies nausea or vomiting.  Pain is well controlled.  Lochia is appropriate.  Objective: Blood pressure 121/87, pulse 76, temperature 98.2 F (36.8 C), temperature source Oral, resp. rate 17, height 5\' 1"  (1.549 m), weight 116.1 kg, last menstrual period 09/26/2021, SpO2 100 %, unknown if currently breastfeeding.  Physical Exam:  General: alert, cooperative and no distress Chest: no respiratory distress Heart:regular rate, distal pulses intact Abdomen: soft, nontender,  Uterine Fundus: firm, appropriately tender DVT Evaluation: No calf swelling or tenderness Extremities: no edema Skin: warm, dry  Recent Labs    06/22/22 2131  HGB 10.2*  HCT 31.7*    Assessment/Plan: DONIELLE KAIGLER is a 36 y.o. 20 s/p nsvd at [redacted]w[redacted]d   PPD#1 - Doing well  Routine postpartum care  Contraception: interval BTL Feeding: breast Dispo: Plan for discharge tomorrow.   LOS: 2 days   [redacted]w[redacted]d, MD  06/24/2022, 7:41 AM

## 2022-06-25 NOTE — Progress Notes (Signed)
   PRENATAL VISIT NOTE  Subjective:  Jenny Carr is a 36 y.o. G4P4004 at [redacted]w[redacted]d being seen today for ongoing prenatal care.  She is currently monitored for the following issues for this high-risk pregnancy and has Obesity (BMI 30-39.9); Obesity in pregnancy; Late prenatal care affecting pregnancy in third trimester; Alpha thalassemia silent carrier; Increased SMA risk; NSVD (normal spontaneous vaginal delivery); Supervision of other normal pregnancy, antepartum; Short interval between pregnancies affecting pregnancy, antepartum; History of gestational hypertension; and Atypical findings seen on Panorama on their problem list.  Patient reports no complaints.  Contractions: Irritability. Vag. Bleeding: None.  Movement: Present. Denies leaking of fluid.   The following portions of the patient's history were reviewed and updated as appropriate: allergies, current medications, past family history, past medical history, past social history, past surgical history and problem list.   Objective:   Vitals:   06/19/22 1041  BP: 139/86  Pulse: 82  Weight: 255 lb 8 oz (115.9 kg)    Fetal Status: Fetal Heart Rate (bpm): 136   Movement: Present     General:  Alert, oriented and cooperative. Patient is in no acute distress.  Skin: Skin is warm and dry. No rash noted.   Cardiovascular: Normal heart rate noted  Respiratory: Normal respiratory effort, no problems with respiration noted  Abdomen: Soft, gravid, appropriate for gestational age.  Pain/Pressure: Absent     Pelvic: Cervical exam deferred        Extremities: Normal range of motion.     Mental Status: Normal mood and affect. Normal behavior. Normal judgment and thought content.   Assessment and Plan:  Pregnancy: G4P4004 at [redacted]w[redacted]d 1. Supervision of other normal pregnancy, antepartum - Planning to use [facility]  2. Obesity in pregnancy  3. Alpha thalassemia silent carrier  4. Increased SMA risk  5. History of gestational  hypertension  6. Short interval between pregnancies affecting pregnancy, antepartum  7. Late prenatal care affecting pregnancy in third trimester  8. Atypical findings seen on Panorama  9. Oligohydramnios - Discussed [place] from today with MFM, recommend for IOL within the week for low fluid   10.  [redacted] weeks gestation of pregnancy  Term labor symptoms and general obstetric precautions including but not limited to vaginal bleeding, contractions, leaking of fluid and fetal movement were reviewed in detail with the patient. Please refer to After Visit Summary for other counseling recommendations.   No follow-ups on file.  Future Appointments  Date Time Provider Department Center  08/01/2022 10:15 AM Warden Fillers, MD [facility]    Vonzella Nipple, PA-C

## 2022-06-27 ENCOUNTER — Encounter: Payer: No Typology Code available for payment source | Admitting: Certified Nurse Midwife

## 2022-06-28 ENCOUNTER — Ambulatory Visit: Payer: No Typology Code available for payment source

## 2022-07-02 ENCOUNTER — Other Ambulatory Visit: Payer: Self-pay

## 2022-07-02 ENCOUNTER — Ambulatory Visit (INDEPENDENT_AMBULATORY_CARE_PROVIDER_SITE_OTHER): Payer: No Typology Code available for payment source

## 2022-07-02 VITALS — BP 148/98 | HR 79

## 2022-07-02 DIAGNOSIS — O139 Gestational [pregnancy-induced] hypertension without significant proteinuria, unspecified trimester: Secondary | ICD-10-CM

## 2022-07-02 MED ORDER — NIFEDIPINE ER OSMOTIC RELEASE 30 MG PO TB24: 30.0000 mg | ORAL_TABLET | Freq: Every day | ORAL | 0 refills | Status: DC

## 2022-07-02 NOTE — Progress Notes (Signed)
Blood Pressure Check Visit  Jenny Carr is here for blood pressure check following spontaneous vaginal on 07/03/2022. BP today is 141/99 recheck of 148/98. Patient denies any dizzness blurred vision headache shortness of breath peripheral edema. Reviewed with Huntley Dec CNM who advised pt begin taking Procardia 30mg  daily. RN shared new plan of care with patient who was agreeable.   Darlyne Russian, RN  07/02/2022  10:17 AM

## 2022-07-04 ENCOUNTER — Encounter: Payer: No Typology Code available for payment source | Admitting: Family Medicine

## 2022-07-04 ENCOUNTER — Other Ambulatory Visit: Payer: No Typology Code available for payment source

## 2022-08-01 ENCOUNTER — Ambulatory Visit (INDEPENDENT_AMBULATORY_CARE_PROVIDER_SITE_OTHER): Payer: No Typology Code available for payment source | Admitting: Obstetrics and Gynecology

## 2022-08-01 ENCOUNTER — Other Ambulatory Visit: Payer: Self-pay

## 2022-08-01 VITALS — BP 130/89 | HR 65

## 2022-08-01 DIAGNOSIS — I10 Essential (primary) hypertension: Secondary | ICD-10-CM

## 2022-08-01 DIAGNOSIS — Z30017 Encounter for initial prescription of implantable subdermal contraceptive: Secondary | ICD-10-CM

## 2022-08-01 LAB — POCT PREGNANCY, URINE: Preg Test, Ur: NEGATIVE

## 2022-08-01 MED ORDER — ETONOGESTREL 68 MG ~~LOC~~ IMPL
68.0000 mg | DRUG_IMPLANT | Freq: Once | SUBCUTANEOUS | Status: AC
  Administered 2022-08-01: 68 mg via SUBCUTANEOUS

## 2022-08-01 NOTE — Progress Notes (Signed)
[place] Partum Visit Note  Jenny Carr is a 36 y.o. G69P4004 female who presents for a postpartum visit. She is 5 weeks postpartum following a normal spontaneous vaginal delivery.  I have fully reviewed the prenatal and intrapartum course. The delivery was at [redacted]w[redacted]d gestational weeks.  Anesthesia: none. Postpartum course has been going well. Baby is doing well. Baby is feeding by bottle - Similac Sensitive RS. Bleeding no bleeding. Bowel function is normal. Bladder function is normal. Patient is not sexually active. Contraception method is none. Postpartum depression screening: negative.   The pregnancy intention screening data noted above was reviewed. Potential methods of contraception were discussed. The patient elected to proceed with No data recorded.   Edinburgh Postnatal Depression Scale - 08/01/22 1027       Edinburgh Postnatal Depression Scale:  In the Past 7 Days   I have been able to laugh and see the funny side of things. 1    I have looked forward with enjoyment to things. 0    I have blamed myself unnecessarily when things went wrong. 0    I have been anxious or worried for no good reason. 0    I have felt scared or panicky for no good reason. 0    Things have been getting on top of me. 0    I have been so unhappy that I have had difficulty sleeping. 0    I have felt sad or miserable. 0    I have been so unhappy that I have been crying. 0    The thought of harming myself has occurred to me. 0    Edinburgh Postnatal Depression Scale Total 1             Health Maintenance Due  Topic Date Due   COVID-19 Vaccine (1) Never done   PAP SMEAR-Modifier  Never done   INFLUENZA VACCINE  05/15/2022    The following portions of the patient's history were reviewed and updated as appropriate: allergies, current medications, past family history, past medical history, past social history, past surgical history, and problem list.  Review of Systems Pertinent items are noted  in HPI.  Objective:  BP 130/89   Pulse 65   LMP 09/26/2021 (Approximate) Comment: SVD 07-16-2021  Breastfeeding No    General:  alert, cooperative, no distress, and moderately obese   Breasts:  not indicated  Lungs: clear to auscultation bilaterally  Heart:  regular rate and rhythm  Abdomen: soft, non-tender; bowel sounds normal; no masses,  no organomegaly   Wound N/a  GU exam:  not indicated       Assessment:    Encounter for postpartum care normal postpartum exam.   Plan:   Essential components of care per ACOG recommendations:  1.  Mood and well being: Patient with negative depression screening today. Reviewed local resources for support.  - Patient tobacco use? No.   - hx of drug use? No.    2. Infant care and feeding:  -Patient currently breastmilk feeding? No.  -Social determinants of health (SDOH) reviewed in EPIC. The following needs were identified: lack of movement/exercise  3. Sexuality, contraception and birth spacing - Patient does not want a pregnancy in the next year.  Desired family size is 4 children.  - Reviewed reproductive life planning. Reviewed contraceptive methods based on pt preferences and effectiveness.  Patient desired Hormonal Implant today.   - Discussed birth spacing of 18 months  4. Sleep and fatigue -Encouraged family/partner/community  support of 4 hrs of uninterrupted sleep to help with mood and fatigue  5. Physical Recovery  - Discussed patients delivery and complications. She describes her labor as good. - Patient had a Vaginal, no problems at delivery. Patient had a 1st degree laceration. Perineal healing reviewed. Patient expressed understanding - Patient has urinary incontinence? No. - Patient is safe to resume physical and sexual activity  6.  Health Maintenance - HM due items addressed Yes - Last pap smear 07/08/20 ,Pap smear not done at today's visit.  -Breast Cancer screening indicated? No.   7. Chronic Disease/Pregnancy  Condition follow up: Hypertension Will refer to family practice for long term surveillance of hypertension, continue procardia. - PCP follow up  Warden Fillers, MD Center for [facility], [facility]

## 2022-08-01 NOTE — Progress Notes (Signed)
     GYNECOLOGY OFFICE PROCEDURE NOTE  Jenny Carr is a 36 y.o. [id number] here for  Nexplanon insertion.  Last pap smear was on 07/08/20 and was normal.  No other gynecologic concerns.  Nexplanon Insertion Procedure Patient identified, informed consent performed, consent signed.   Patient does understand that irregular bleeding is a very common side effect of this medication. She was advised to have backup contraception for one week after placement. Pregnancy test in clinic today was negative.  Appropriate time out taken.  Patient's left arm was prepped and draped in the usual sterile fashion. The ruler used to measure and mark insertion area.  Patient was prepped with alcohol swab and then injected with 3 ml of 1% lidocaine.  She was prepped with betadine, Nexplanon removed from packaging,  Device confirmed in needle, then inserted full length of needle and withdrawn per handbook instructions. Nexplanon was able to palpated in the patient's arm; patient palpated the insert herself. There was minimal blood loss.  Patient insertion site covered with guaze and a pressure bandage to reduce any bruising.  The patient tolerated the procedure well and was given post procedure instructions.      Lynnda Shields, MD, [place] for [facility], [place]

## 2022-08-01 NOTE — Addendum Note (Signed)
Addended by: Madalyn Rob D on: 08/01/2022 11:34 AM   Modules accepted: Orders

## 2022-08-13 ENCOUNTER — Other Ambulatory Visit: Payer: Self-pay

## 2022-08-13 DIAGNOSIS — Z8759 Personal history of other complications of pregnancy, childbirth and the puerperium: Secondary | ICD-10-CM

## 2022-08-13 DIAGNOSIS — Z3480 Encounter for supervision of other normal pregnancy, unspecified trimester: Secondary | ICD-10-CM

## 2022-08-13 DIAGNOSIS — O139 Gestational [pregnancy-induced] hypertension without significant proteinuria, unspecified trimester: Secondary | ICD-10-CM

## 2022-08-13 MED ORDER — NIFEDIPINE ER OSMOTIC RELEASE 30 MG PO TB24: 30.0000 mg | ORAL_TABLET | Freq: Every day | ORAL | 0 refills | Status: AC

## 2022-09-27 ENCOUNTER — Encounter: Payer: Self-pay | Admitting: *Deleted

## 2023-07-05 ENCOUNTER — Telehealth: Payer: Self-pay

## 2023-07-05 NOTE — Telephone Encounter (Signed)
Medicaid Managed Care   Unsuccessful Outreach Note  07/05/2023 Name: LANETRA HERRIGES MRN: [id number] DOB: 1986/03/25  Referred by: Department, [facility] Reason for referral : No chief complaint on file.   An unsuccessful telephone outreach was attempted today. The patient was referred to the case management team for assistance with care management and care coordination.   Follow Up Plan: If patient returns call to provider office, please advise to call [employer]ed[employer] Care Management Care Guide Nicholes Rough* at [phone]*  Nicholes Rough, CMA Care Guide [employer]

## 2024-05-14 ENCOUNTER — Encounter: Payer: Self-pay | Admitting: Obstetrics and Gynecology

## 2024-05-14 ENCOUNTER — Other Ambulatory Visit: Payer: Self-pay

## 2024-05-14 ENCOUNTER — Other Ambulatory Visit (HOSPITAL_COMMUNITY)
Admission: RE | Admit: 2024-05-14 | Discharge: 2024-05-14 | Disposition: A | Discharge status: Home / Self Care | Source: Ambulatory Visit | Attending: Obstetrics and Gynecology | Admitting: Obstetrics and Gynecology

## 2024-05-14 ENCOUNTER — Ambulatory Visit (INDEPENDENT_AMBULATORY_CARE_PROVIDER_SITE_OTHER): Admitting: Obstetrics and Gynecology

## 2024-05-14 VITALS — BP 133/83 | HR 78 | Wt 272.0 lb

## 2024-05-14 DIAGNOSIS — Z113 Encounter for screening for infections with a predominantly sexual mode of transmission: Secondary | ICD-10-CM

## 2024-05-14 DIAGNOSIS — Z01419 Encounter for gynecological examination (general) (routine) without abnormal findings: Secondary | ICD-10-CM | POA: Insufficient documentation

## 2024-05-14 DIAGNOSIS — Z1331 Encounter for screening for depression: Secondary | ICD-10-CM | POA: Diagnosis not present

## 2024-05-14 NOTE — Patient Instructions (Signed)
 Breast Self-Awareness Breast self-awareness is knowing how your breasts look and feel. You need to: Check your breasts on a regular basis. Tell your doctor about any changes. Become familiar with the look and feel of your breasts. This can help you catch a breast problem while it is still small and can be treated. You should do breast self-exams even if you have breast implants. What you need: A mirror. A well-lit room. A pillow or other soft object. How to do a breast self-exam Follow these steps to do a breast self-exam: Look for changes  Take off all the clothes above your waist. Stand in front of a mirror in a room with good lighting. Put your hands down at your sides. Compare your breasts in the mirror. Look for any difference between them, such as: A difference in shape. A difference in size. Wrinkles, dips, and bumps in one breast and not the other. Look at each breast for changes in the skin, such as: Redness. Scaly areas. Skin that has gotten thicker. Dimpling. Open sores (ulcers). Look for changes in your nipples, such as: Fluid coming out of a nipple. Fluid around a nipple. Bleeding. Dimpling. Redness. A nipple that looks pushed in (retracted), or that has changed position. Feel for changes Lie on your back. Feel each breast. To do this: Pick a breast to feel. Place a pillow under the shoulder closest to that breast. Put the arm closest to that breast behind your head. Feel the nipple area of that breast using the hand of your other arm. Feel the area with the pads of your three middle fingers by making small circles with your fingers. Use light, medium, and firm pressure. Continue the overlapping circles, moving downward over the breast. Keep making circles with your fingers. Stop when you feel your ribs. Start making circles with your fingers again, this time going upward until you reach your collarbone. Then, make circles outward across your breast and into your  armpit area. Squeeze your nipple. Check for discharge and lumps. Repeat these steps to check your other breast. Sit or stand in the tub or shower. With soapy water on your skin, feel each breast the same way you did when you were lying down. Write down what you find Writing down what you find can help you remember what to tell your doctor. Write down: What is normal for each breast. Any changes you find in each breast. These include: The kind of changes you find. A tender or painful breast. Any lump you find. Write down its size and where it is. When you last had your monthly period (menstrual cycle). General tips If you are breastfeeding, the best time to check your breasts is after you feed your baby or after you use a breast pump. If you get monthly bleeding, the best time to check your breasts is 5-7 days after your monthly cycle ends. With time, you will become comfortable with the self-exam. You will also start to know if there are changes in your breasts. Contact a doctor if: You see a change in the shape or size of your breasts or nipples. You see a change in the skin of your breast or nipples, such as red or scaly skin. You have fluid coming from your nipples that is not normal. You find a new lump or thick area. You have breast pain. You have any concerns about your breast health. Summary Breast self-awareness includes looking for changes in your breasts and feeling for changes  within your breasts. You should do breast self-awareness in front of a mirror in a well-lit room. If you get monthly periods (menstrual cycles), the best time to check your breasts is 5-7 days after your period ends. Tell your doctor about any changes you see in your breasts. Changes include changes in size, changes on the skin, painful or tender breasts, or fluid from your nipples that is not normal. This information is not intended to replace advice given to you by your health care provider. Make sure  you discuss any questions you have with your health care provider. Document Revised: 03/08/2022 Document Reviewed: 08/03/2021 Elsevier Patient Education  2024 [employer].

## 2024-05-14 NOTE — Progress Notes (Signed)
 ANNUAL EXAM Patient name: Jenny Carr MRN [id number]  Date of birth: 19-Jul-1986 Chief Complaint:   Gynecologic Exam  History of Present Illness:   Jenny Carr is a 38 y.o. [phone] being seen today for a routine annual exam.  Current complaints: annual  Menstrual concerns? No  irregular with nexplanon  in place Breast or nipple changes? No  Contraception use? Yes Nexplanon  since 2023 Sexually active? Yes female partner  Patient's last menstrual period was 03/30/2024 (within days).   Last pap 07/08/2020 NILM, HR HPV negative Last mammogram: n/a - but getting one done as it was offered and covered by her job.  Last colonoscopy: n/a.      06/19/2022   11:51 AM 07/06/2021   11:04 AM 07/05/2021    9:05 AM 05/11/2021    8:38 AM 01/04/2021    8:29 AM  Depression screen PHQ 2/9  Decreased Interest 0 0 0 1 0  Down, Depressed, Hopeless 0 0 1 1 0  PHQ - 2 Score 0 0 1 2 0  Altered sleeping 0 0 0 0 0  Tired, decreased energy 0 1 0 0 0  Change in appetite 0 0 0 0 0  Feeling bad or failure about yourself  0 1 1 1 1   Trouble concentrating 0 0 0 0 0  Moving slowly or fidgety/restless 0 0 0 0 0  Suicidal thoughts 0 0 0 0 0  PHQ-9 Score 0 2 2 3 1   Difficult doing work/chores Not difficult at all Not difficult at all  Not difficult at all Not difficult at all        06/19/2022   11:51 AM 07/06/2021   11:05 AM 07/05/2021    9:06 AM 05/11/2021    8:38 AM  GAD 7 : Generalized Anxiety Score  Nervous, Anxious, on Edge 0 0 0 0  Control/stop worrying 0 1 1 0  Worry too much - different things 0 1 1 1   Trouble relaxing 0 0 0 0  Restless 0 0 0 0  Easily annoyed or irritable 0 0 0 0  Afraid - awful might happen 0 0 0 0  Total GAD 7 Score 0 2 2 1   Anxiety Difficulty  Not difficult at all  Not difficult at all     Review of Systems:   Pertinent items are noted in HPI Denies any headaches, blurred vision, fatigue, shortness of breath, chest pain, abdominal pain, abnormal vaginal  discharge/itching/odor/irritation, problems with periods, bowel movements, urination, or intercourse unless otherwise stated above. Pertinent History Reviewed:  Reviewed past medical,surgical, social and family history.  Reviewed problem list, medications and allergies. Physical Assessment:   Vitals:   05/14/24 1021  BP: (!) 139/92  Pulse: 72  Weight: 272 lb (123.4 kg)  Body mass index is 51.39 kg/m.        Physical Examination:   General appearance - well appearing, and in no distress  Mental status - alert, oriented to person, place, and time  Psych:  She has a normal mood and affect  Skin - warm and dry, normal color, no suspicious lesions noted  Chest - effort normal, all lung fields clear to auscultation bilaterally  Heart - normal rate and regular rhythm  Breasts - breasts appear normal, no suspicious masses, no skin or nipple changes or  axillary nodes  Abdomen - soft, nontender, nondistended, no masses or organomegaly  Pelvic - deferred  Extremities:  No swelling or varicosities noted  Chaperone present for exam  No results found for this or any previous visit (from the past 24 hours).    Assessment & Plan:  1. Well woman exam with routine gynecological exam (Primary) - Cervical cancer screening: Discussed guidelines. Pap with HPV due 2026 - GC/CT: accepts - Birth Control: Nexplanon  - Breast Health: Encouraged self breast awareness/SBE. Teaching provided.  - F/U 12 months and prn  - Cervicovaginal ancillary only - RPR+HBsAg+HCVAb+...  2. Screening examination for STI STI testing today  - Cervicovaginal ancillary only - RPR+HBsAg+HCVAb+...  Meds: No orders of the defined types were placed in this encounter.   Follow-up: Return in about 1 year (around 05/14/2025), or if symptoms worsen or fail to improve, for Annual GYN.  Carter Quarry, MD 05/14/2024 10:31 AM

## 2024-05-15 ENCOUNTER — Ambulatory Visit: Payer: Self-pay | Admitting: Obstetrics and Gynecology

## 2024-05-15 LAB — CERVICOVAGINAL ANCILLARY ONLY
Chlamydia: NEGATIVE
Comment: NEGATIVE
Comment: NEGATIVE
Comment: NORMAL
Neisseria Gonorrhea: NEGATIVE
Trichomonas: NEGATIVE

## 2024-05-15 LAB — RPR+HBSAG+HCVAB+...
HIV Screen 4th Generation wRfx: NONREACTIVE
Hep C Virus Ab: NONREACTIVE
Hepatitis B Surface Ag: NEGATIVE
RPR Ser Ql: NONREACTIVE

## 2024-06-24 ENCOUNTER — Other Ambulatory Visit: Payer: Self-pay

## 2024-06-24 NOTE — Progress Notes (Signed)
 Error

## 2024-07-27 ENCOUNTER — Other Ambulatory Visit (HOSPITAL_COMMUNITY): Payer: Self-pay
